# Patient Record
Sex: Female | Born: 1970
Health system: Southern US, Community
[De-identification: ages and names within clinical notes are randomized; demographics above are authoritative.]

## PROBLEM LIST (undated history)

## (undated) DIAGNOSIS — N200 Calculus of kidney: Secondary | ICD-10-CM

## (undated) DIAGNOSIS — D649 Anemia, unspecified: Secondary | ICD-10-CM

## (undated) DIAGNOSIS — G43009 Migraine without aura, not intractable, without status migrainosus: Secondary | ICD-10-CM

## (undated) DIAGNOSIS — Z8042 Family history of malignant neoplasm of prostate: Secondary | ICD-10-CM

## (undated) DIAGNOSIS — Z803 Family history of malignant neoplasm of breast: Secondary | ICD-10-CM

## (undated) DIAGNOSIS — Z800 Family history of malignant neoplasm of digestive organs: Secondary | ICD-10-CM

## (undated) HISTORY — DX: Calculus of kidney: N20.0

## (undated) HISTORY — PX: WISDOM TOOTH EXTRACTION: SHX21

## (undated) HISTORY — DX: Family history of malignant neoplasm of prostate: Z80.42

## (undated) HISTORY — DX: Family history of malignant neoplasm of breast: Z80.3

## (undated) HISTORY — DX: Family history of malignant neoplasm of digestive organs: Z80.0

## (undated) HISTORY — DX: Migraine without aura, not intractable, without status migrainosus: G43.009

---

## 2015-05-23 LAB — HM PAP SMEAR: HM Pap smear: NORMAL

## 2015-06-03 DIAGNOSIS — Z01419 Encounter for gynecological examination (general) (routine) without abnormal findings: Secondary | ICD-10-CM | POA: Diagnosis not present

## 2015-06-03 DIAGNOSIS — Z130 Encounter for screening for diseases of the blood and blood-forming organs and certain disorders involving the immune mechanism: Secondary | ICD-10-CM | POA: Diagnosis not present

## 2015-07-08 ENCOUNTER — Other Ambulatory Visit: Payer: Self-pay | Admitting: Obstetrics

## 2015-07-08 DIAGNOSIS — Z1231 Encounter for screening mammogram for malignant neoplasm of breast: Secondary | ICD-10-CM

## 2015-08-06 ENCOUNTER — Other Ambulatory Visit: Payer: Self-pay | Admitting: Obstetrics

## 2015-08-06 ENCOUNTER — Ambulatory Visit
Admission: RE | Admit: 2015-08-06 | Discharge: 2015-08-06 | Disposition: A | Discharge status: Home / Self Care | Payer: 59 | Source: Ambulatory Visit | Attending: Obstetrics | Admitting: Obstetrics

## 2015-08-06 DIAGNOSIS — Z1231 Encounter for screening mammogram for malignant neoplasm of breast: Secondary | ICD-10-CM | POA: Diagnosis not present

## 2015-08-23 ENCOUNTER — Other Ambulatory Visit: Payer: Self-pay | Admitting: Obstetrics

## 2015-08-23 DIAGNOSIS — R928 Other abnormal and inconclusive findings on diagnostic imaging of breast: Secondary | ICD-10-CM

## 2015-08-30 ENCOUNTER — Ambulatory Visit
Admission: RE | Admit: 2015-08-30 | Discharge: 2015-08-30 | Disposition: A | Discharge status: Home / Self Care | Payer: 59 | Source: Ambulatory Visit | Attending: Obstetrics | Admitting: Obstetrics

## 2015-08-30 DIAGNOSIS — R928 Other abnormal and inconclusive findings on diagnostic imaging of breast: Secondary | ICD-10-CM | POA: Diagnosis not present

## 2015-08-30 DIAGNOSIS — N6489 Other specified disorders of breast: Secondary | ICD-10-CM | POA: Insufficient documentation

## 2015-08-30 DIAGNOSIS — R922 Inconclusive mammogram: Secondary | ICD-10-CM | POA: Diagnosis not present

## 2015-08-31 ENCOUNTER — Other Ambulatory Visit: Payer: Self-pay | Admitting: Obstetrics

## 2015-08-31 DIAGNOSIS — N63 Unspecified lump in unspecified breast: Secondary | ICD-10-CM

## 2015-09-07 ENCOUNTER — Ambulatory Visit
Admission: RE | Admit: 2015-09-07 | Discharge: 2015-09-07 | Disposition: A | Discharge status: Home / Self Care | Payer: 59 | Source: Ambulatory Visit | Attending: Obstetrics | Admitting: Obstetrics

## 2015-09-07 DIAGNOSIS — N63 Unspecified lump in unspecified breast: Secondary | ICD-10-CM

## 2015-09-07 DIAGNOSIS — N6032 Fibrosclerosis of left breast: Secondary | ICD-10-CM | POA: Insufficient documentation

## 2015-09-07 DIAGNOSIS — D242 Benign neoplasm of left breast: Secondary | ICD-10-CM | POA: Insufficient documentation

## 2015-09-07 DIAGNOSIS — N6022 Fibroadenosis of left breast: Secondary | ICD-10-CM | POA: Diagnosis not present

## 2015-09-07 HISTORY — PX: BREAST BIOPSY: SHX20

## 2015-09-08 LAB — SURGICAL PATHOLOGY

## 2015-09-21 ENCOUNTER — Other Ambulatory Visit
Admission: RE | Admit: 2015-09-21 | Discharge: 2015-09-21 | Disposition: A | Discharge status: Home / Self Care | Payer: 59 | Source: Ambulatory Visit | Attending: Surgery | Admitting: Surgery

## 2015-09-21 DIAGNOSIS — N63 Unspecified lump in breast: Secondary | ICD-10-CM | POA: Diagnosis not present

## 2015-09-21 LAB — CBC WITH DIFFERENTIAL/PLATELET
Basophils Absolute: 0 10*3/uL (ref 0–0.1)
Basophils Relative: 1 %
Eosinophils Absolute: 0.1 10*3/uL (ref 0–0.7)
Eosinophils Relative: 1 %
HEMATOCRIT: 38.2 % (ref 35.0–47.0)
HEMOGLOBIN: 13.2 g/dL (ref 12.0–16.0)
LYMPHS ABS: 2.7 10*3/uL (ref 1.0–3.6)
LYMPHS PCT: 39 %
MCH: 30.6 pg (ref 26.0–34.0)
MCHC: 34.4 g/dL (ref 32.0–36.0)
MCV: 88.9 fL (ref 80.0–100.0)
Monocytes Absolute: 0.5 10*3/uL (ref 0.2–0.9)
Monocytes Relative: 7 %
NEUTROS PCT: 52 %
Neutro Abs: 3.7 10*3/uL (ref 1.4–6.5)
Platelets: 283 10*3/uL (ref 150–440)
RBC: 4.3 MIL/uL (ref 3.80–5.20)
RDW: 12.7 % (ref 11.5–14.5)
WBC: 6.9 10*3/uL (ref 3.6–11.0)

## 2015-09-21 LAB — BASIC METABOLIC PANEL
Anion gap: 8 (ref 5–15)
BUN: 16 mg/dL (ref 6–20)
CHLORIDE: 105 mmol/L (ref 101–111)
CO2: 27 mmol/L (ref 22–32)
Calcium: 9.3 mg/dL (ref 8.9–10.3)
Creatinine, Ser: 0.78 mg/dL (ref 0.44–1.00)
GFR calc non Af Amer: >60 mL/min (ref >60)
Glucose, Bld: 103 mg/dL — ABNORMAL HIGH (ref 65–99)
POTASSIUM: 4.1 mmol/L (ref 3.5–5.1)
SODIUM: 140 mmol/L (ref 135–145)

## 2015-09-22 ENCOUNTER — Other Ambulatory Visit: Payer: Self-pay | Admitting: Surgery

## 2015-09-22 DIAGNOSIS — N63 Unspecified lump in unspecified breast: Secondary | ICD-10-CM

## 2015-10-07 ENCOUNTER — Encounter: Payer: Self-pay | Admitting: *Deleted

## 2015-10-07 ENCOUNTER — Inpatient Hospital Stay: Admission: RE | Admit: 2015-10-07 | Payer: 59 | Source: Ambulatory Visit

## 2015-10-07 NOTE — Patient Instructions (Signed)
  Your procedure is scheduled on: 10-15-15 Report to [facility] @ 9:30 PER PT   Remember: Instructions that are not followed completely may result in serious medical risk, up to and including death, or upon the discretion of your surgeon and anesthesiologist your surgery may need to be rescheduled.    _X___ 1. Do not eat food or drink liquids after midnight. No gum chewing or hard candies.     _X___ 2. No Alcohol for 24 hours before or after surgery.   ____ 3. Bring all medications with you on the day of surgery if instructed.    ____ 4. Notify your doctor if there is any change in your medical condition     (cold, fever, infections).     Do not wear jewelry, make-up, hairpins, clips or nail polish.  Do not wear lotions, powders, or perfumes. You may wear deodorant.  Do not shave 48 hours prior to surgery. Men may shave face and neck.  Do not bring valuables to the hospital.    [facility] is not responsible for any belongings or valuables.               Contacts, dentures or bridgework may not be worn into surgery.  Leave your suitcase in the car. After surgery it may be brought to your room.  For patients admitted to the hospital, discharge time is determined by your treatment team.   Patients discharged the day of surgery will not be allowed to drive home.   Please read over the following fact sheets that you were given:     ____ Take these medicines the morning of surgery with A SIP OF WATER:    1. NONE  2.   3.   4.  5.  6.  ____ Fleet Enema (as directed)   ____ Use CHG Soap as directed  ____ Use inhalers on the day of surgery  ____ Stop metformin 2 days prior to surgery    ____ Take 1/2 of usual insulin dose the night before surgery and none on the morning of surgery.   ____ Stop Coumadin/Plavix/aspirin-N/A  _X___ Stop Anti-inflammatories-NO NSAIDS OR ASA PRODUCTS-TYLENOL OK TO TAKE   ____ Stop supplements until after surgery.    ____ Bring  C-Pap to the hospital.

## 2015-10-15 ENCOUNTER — Ambulatory Visit: Payer: 59 | Admitting: Anesthesiology

## 2015-10-15 ENCOUNTER — Encounter
Admission: RE | Disposition: A | Discharge status: Home / Self Care | Payer: Self-pay | Source: Ambulatory Visit | Attending: Surgery

## 2015-10-15 ENCOUNTER — Encounter: Payer: Self-pay | Admitting: *Deleted

## 2015-10-15 ENCOUNTER — Ambulatory Visit
Admission: RE | Admit: 2015-10-15 | Discharge: 2015-10-15 | Disposition: A | Discharge status: Home / Self Care | Payer: 59 | Source: Ambulatory Visit | Attending: Surgery | Admitting: Surgery

## 2015-10-15 DIAGNOSIS — N63 Unspecified lump in unspecified breast: Secondary | ICD-10-CM

## 2015-10-15 DIAGNOSIS — N62 Hypertrophy of breast: Secondary | ICD-10-CM | POA: Insufficient documentation

## 2015-10-15 DIAGNOSIS — R928 Other abnormal and inconclusive findings on diagnostic imaging of breast: Secondary | ICD-10-CM | POA: Diagnosis not present

## 2015-10-15 DIAGNOSIS — N6022 Fibroadenosis of left breast: Secondary | ICD-10-CM | POA: Insufficient documentation

## 2015-10-15 DIAGNOSIS — N189 Chronic kidney disease, unspecified: Secondary | ICD-10-CM | POA: Diagnosis not present

## 2015-10-15 DIAGNOSIS — D242 Benign neoplasm of left breast: Secondary | ICD-10-CM | POA: Insufficient documentation

## 2015-10-15 DIAGNOSIS — N6032 Fibrosclerosis of left breast: Secondary | ICD-10-CM | POA: Diagnosis not present

## 2015-10-15 HISTORY — DX: Anemia, unspecified: D64.9

## 2015-10-15 HISTORY — PX: BREAST LUMPECTOMY WITH NEEDLE LOCALIZATION: SHX5759

## 2015-10-15 HISTORY — PX: BREAST LUMPECTOMY: SHX2

## 2015-10-15 LAB — POCT PREGNANCY, URINE: Preg Test, Ur: NEGATIVE

## 2015-10-15 SURGERY — BREAST LUMPECTOMY WITH NEEDLE LOCALIZATION
Anesthesia: General | Laterality: Left

## 2015-10-15 MED ORDER — OXYCODONE HCL 5 MG PO TABS: 5.0000 mg | ORAL_TABLET | Freq: Once | ORAL | Status: DC | PRN

## 2015-10-15 MED ORDER — DEXMEDETOMIDINE HCL 200 MCG/2ML IV SOLN: INTRAVENOUS | Status: DC | PRN

## 2015-10-15 MED ORDER — BUPIVACAINE-EPINEPHRINE 0.5% -1:200000 IJ SOLN
INTRAMUSCULAR | Status: DC | PRN
  Administered 2015-10-15: 10 mL

## 2015-10-15 MED ORDER — DEXAMETHASONE SODIUM PHOSPHATE 10 MG/ML IJ SOLN
INTRAMUSCULAR | Status: DC | PRN
  Administered 2015-10-15: 10 mg via INTRAVENOUS

## 2015-10-15 MED ORDER — HYDROCODONE-ACETAMINOPHEN 5-325 MG PO TABS: 1.0000 | ORAL_TABLET | ORAL | Status: DC | PRN

## 2015-10-15 MED ORDER — LIDOCAINE 2% (20 MG/ML) 5 ML SYRINGE
INTRAMUSCULAR | Status: DC | PRN
  Administered 2015-10-15: 80 mg via INTRAVENOUS

## 2015-10-15 MED ORDER — PROPOFOL 10 MG/ML IV BOLUS
INTRAVENOUS | Status: DC | PRN
  Administered 2015-10-15: 150 mg via INTRAVENOUS
  Administered 2015-10-15: 50 mg via INTRAVENOUS

## 2015-10-15 MED ORDER — MIDAZOLAM HCL 5 MG/5ML IJ SOLN
INTRAMUSCULAR | Status: DC | PRN
  Administered 2015-10-15: 2 mg via INTRAVENOUS

## 2015-10-15 MED ORDER — OXYCODONE HCL 5 MG/5ML PO SOLN: 5.0000 mg | Freq: Once | ORAL | Status: DC | PRN

## 2015-10-15 MED ORDER — FENTANYL CITRATE (PF) 100 MCG/2ML IJ SOLN
25.0000 ug | INTRAMUSCULAR | Status: DC | PRN
  Administered 2015-10-15 (×4): 25 ug via INTRAVENOUS

## 2015-10-15 MED ORDER — ONDANSETRON HCL 4 MG/2ML IJ SOLN
INTRAMUSCULAR | Status: DC | PRN
  Administered 2015-10-15: 4 mg via INTRAVENOUS

## 2015-10-15 MED ORDER — FAMOTIDINE 20 MG PO TABS
ORAL_TABLET | ORAL | Status: AC
  Filled 2015-10-15: qty 1

## 2015-10-15 MED ORDER — FENTANYL CITRATE (PF) 100 MCG/2ML IJ SOLN
INTRAMUSCULAR | Status: AC
  Administered 2015-10-15: 25 ug via INTRAVENOUS
  Filled 2015-10-15: qty 2

## 2015-10-15 MED ORDER — BUPIVACAINE-EPINEPHRINE (PF) 0.5% -1:200000 IJ SOLN
INTRAMUSCULAR | Status: AC
  Filled 2015-10-15: qty 30

## 2015-10-15 MED ORDER — FAMOTIDINE 20 MG PO TABS
20.0000 mg | ORAL_TABLET | Freq: Once | ORAL | Status: AC
  Administered 2015-10-15: 20 mg via ORAL

## 2015-10-15 MED ORDER — FENTANYL CITRATE (PF) 100 MCG/2ML IJ SOLN
INTRAMUSCULAR | Status: DC | PRN
  Administered 2015-10-15 (×2): 25 ug via INTRAVENOUS
  Administered 2015-10-15: 50 ug via INTRAVENOUS

## 2015-10-15 MED ORDER — LACTATED RINGERS IV SOLN
INTRAVENOUS | Status: DC
  Administered 2015-10-15: 12:00:00 via INTRAVENOUS

## 2015-10-15 SURGICAL SUPPLY — 25 items
BLADE SURG 15 STRL LF DISP TIS (BLADE) ×1 IMPLANT
BLADE SURG 15 STRL SS (BLADE) ×1
CANISTER SUCT 1200ML W/VALVE (MISCELLANEOUS) ×2 IMPLANT
CHLORAPREP W/TINT 26ML (MISCELLANEOUS) ×2 IMPLANT
DEVICE DUBIN SPECIMEN MAMMOGRA (MISCELLANEOUS) ×2 IMPLANT
DRAPE LAPAROTOMY 77X122 PED (DRAPES) ×2 IMPLANT
ELECT REM PT RETURN 9FT ADLT (ELECTROSURGICAL) ×2
ELECTRODE REM PT RTRN 9FT ADLT (ELECTROSURGICAL) ×1 IMPLANT
GLOVE BIO SURGEON STRL SZ7.5 (GLOVE) ×2 IMPLANT
GOWN STRL REUS W/ TWL LRG LVL3 (GOWN DISPOSABLE) ×2 IMPLANT
GOWN STRL REUS W/TWL LRG LVL3 (GOWN DISPOSABLE) ×2
KIT RM TURNOVER STRD PROC AR (KITS) ×2 IMPLANT
LABEL OR SOLS (LABEL) ×2 IMPLANT
LIQUID BAND (GAUZE/BANDAGES/DRESSINGS) ×2 IMPLANT
MARGIN MAP 10MM (MISCELLANEOUS) ×2 IMPLANT
NEEDLE HYPO 25X1 1.5 SAFETY (NEEDLE) ×2 IMPLANT
PACK BASIN MINOR ARMC (MISCELLANEOUS) ×2 IMPLANT
SUT CHROMIC 4 0 RB 1X27 (SUTURE) ×2 IMPLANT
SUT ETHILON 3-0 FS-10 30 BLK (SUTURE) ×2
SUT MNCRL 4-0 (SUTURE) ×1
SUT MNCRL 4-0 27XMFL (SUTURE) ×1
SUTURE EHLN 3-0 FS-10 30 BLK (SUTURE) ×1 IMPLANT
SUTURE MNCRL 4-0 27XMF (SUTURE) ×1 IMPLANT
SYRINGE 10CC LL (SYRINGE) ×2 IMPLANT
WATER STERILE IRR 1000ML POUR (IV SOLUTION) ×2 IMPLANT

## 2015-10-15 NOTE — Anesthesia Postprocedure Evaluation (Signed)
Anesthesia Post Note  Patient: Gina Fleming  Procedure(s) Performed: Procedure(s) (LRB): BREAST LUMPECTOMY WITH NEEDLE LOCALIZATION (Left)  Patient location during evaluation: PACU Anesthesia Type: General Level of consciousness: awake and alert Pain management: pain level controlled Vital Signs Assessment: post-procedure vital signs reviewed and stable Respiratory status: spontaneous breathing, nonlabored ventilation, respiratory function stable and patient connected to nasal cannula oxygen Cardiovascular status: blood pressure returned to baseline and stable Postop Assessment: no signs of nausea or vomiting Anesthetic complications: no    Last Vitals:  Filed Vitals:   10/15/15 1353 10/15/15 1404  BP: 112/79 110/72  Pulse: 86   Temp: 37.2 C 36.1 C  Resp: 20 16    Last Pain:  Filed Vitals:   10/15/15 1410  PainSc: 3                  Broadus John K Piscitello

## 2015-10-15 NOTE — Anesthesia Procedure Notes (Signed)
Procedure Name: LMA Insertion Date/Time: 10/15/2015 12:02 PM Performed by: Doreen Salvage Pre-anesthesia Checklist: Patient identified, Patient being monitored, Timeout performed, Emergency Drugs available and Suction available Patient Re-evaluated:Patient Re-evaluated prior to inductionOxygen Delivery Method: Circle system utilized Preoxygenation: Pre-oxygenation with 100% oxygen Intubation Type: IV induction Ventilation: Mask ventilation without difficulty LMA: LMA inserted LMA Size: 3.5 Tube type: Oral Number of attempts: 1 Placement Confirmation: positive ETCO2 and breath sounds checked- equal and bilateral Tube secured with: Tape Dental Injury: Teeth and Oropharynx as per pre-operative assessment

## 2015-10-15 NOTE — H&P (Signed)
  She reports no change in condition since the day of the office examination.  She does have history of abnormal mammogram and has had ultrasound-guided core needle biopsy with findings of micropapillary was at the 3:30 position of the left breast 4 cm from the nipple  She has had x-ray needle localization. I have reviewed the subsequent mammograms.  I discussed the plan for excision of left breast mass. The left side was marked YES.

## 2015-10-15 NOTE — Op Note (Signed)
OPERATIVE REPORT  PREOPERATIVE  DIAGNOSIS: . Intraductal papillomas left breast  POSTOPERATIVE DIAGNOSIS: . Intraductal papillomas {  PROCEDURE: . Excision left breast mass  ANESTHESIA:  General  SURGEON: Rochel Brome  MD   INDICATIONS: . She had recent ultrasound-guided core needle biopsy of a left breast mass with findings of intraductal papillomas. Excision was recommended for further evaluation and treatment.  She did have preoperative insertion of a Kopan's wire. I reviewed the mammograms demonstrating the proximity of the wire and the biopsy marker.  With the patient on the operating table in the supine position she was placed under anesthesia. The left arm was placed on a lateral arm support. The dressing was removed from the left breast exposing the Kopan's wire which entered the lateral aspect of the left breast. The wire was cut 2 cm from the skin. The breast was prepared with ChloraPrep solution and draped in a sterile manner.. A curvilinear incision was made in the lateral aspect of the left breast from approximately 2:00 to 4:00 position some 7 cm from the nipple. This is carried down through subcutaneous tissues and encountered the wire. A portion of tissue surrounding the wire which was spherical and approximately 3 cm in dimension was removed. The specimen was marked with margin maps to mark the cranial caudal medial lateral superficial and deep margins. The specimen was submitted for specimen mammogram. I reviewed the x-ray demonstrating the location of the biopsy marker within the specimen. The specimen was sent on to the lab for routine pathology. The wound was inspected and could see hemostasis was intact. The wound was infiltrated with half percent Sensorcaine with epinephrine. Skin edges were also infiltrated. The wound was closed with running 4-0 Monocryl subcuticular suture and LiquiBand. The patient tolerated surgery satisfactorily and was then prepared for transfer to the  recovery room  [facility].D.

## 2015-10-15 NOTE — Transfer of Care (Signed)
Immediate Anesthesia Transfer of Care Note  Patient: Gina Fleming  Procedure(s) Performed: Procedure(s): BREAST LUMPECTOMY WITH NEEDLE LOCALIZATION (Left)  Patient Location: PACU  Anesthesia Type:General  Level of Consciousness: sedated  Airway & Oxygen Therapy: Patient Spontanous Breathing and Patient connected to face mask oxygen  Post-op Assessment: Report given to RN and Post -op Vital signs reviewed and stable  Post vital signs: Reviewed and stable  Last Vitals:  Filed Vitals:   10/15/15 1108  Temp: 37.3 C    Last Pain: There were no vitals filed for this visit.       Complications: No apparent anesthesia complications

## 2015-10-15 NOTE — Anesthesia Preprocedure Evaluation (Signed)
Anesthesia Evaluation  Patient identified by MRN, date of birth, ID band Patient awake    Reviewed: Allergy & Precautions, H&P , NPO status , Patient's Chart, lab work & pertinent test results  History of Anesthesia Complications Negative for: history of anesthetic complications  Airway Mallampati: III  TM Distance: >3 FB Neck ROM: full    Dental  (+) Poor Dentition   Pulmonary neg pulmonary ROS, neg shortness of breath,    Pulmonary exam normal breath sounds clear to auscultation       Cardiovascular Exercise Tolerance: Good (-) angina(-) Past MI and (-) DOE negative cardio ROS Normal cardiovascular exam Rhythm:regular Rate:Normal     Neuro/Psych  Headaches, negative psych ROS   GI/Hepatic negative GI ROS, Neg liver ROS,   Endo/Other  negative endocrine ROS  Renal/GU Renal disease  negative genitourinary   Musculoskeletal   Abdominal   Peds  Hematology negative hematology ROS (+)   Anesthesia Other Findings Past Medical History:   Headache                                                       Comment:MIGRAINES   Anemia                                                         Comment:H/O    Chronic kidney disease                                         Comment:H/O STONES  Past Surgical History:   WISDOM TOOTH EXTRACTION                                       BREAST EXCISIONAL BIOPSY                        Left 10/15/2015     Comment:UNK   BREAST BIOPSY                                   Left 09/07/2015      Comment:US guided biopsy w/ clip  BMI    Body Mass Index   26.62 kg/m 2      Reproductive/Obstetrics negative OB ROS                             Anesthesia Physical Anesthesia Plan  ASA: III  Anesthesia Plan: General LMA   Post-op Pain Management:    Induction:   Airway Management Planned:   Additional Equipment:   Intra-op Plan:   Post-operative Plan:    Informed Consent: I have reviewed the patients History and Physical, chart, labs and discussed the procedure including the risks, benefits and alternatives for the proposed anesthesia with the patient or authorized representative who has indicated his/her understanding and acceptance.   Dental Advisory Given  Plan  Discussed with: Anesthesiologist, CRNA and Surgeon  Anesthesia Plan Comments:         Anesthesia Quick Evaluation

## 2015-10-15 NOTE — Discharge Instructions (Signed)
Take Tylenol or Norco if needed for pain.  Wear bra as desired for support and comfort.  May shower.  Gradually resume usual activities as tolerated.  AMBULATORY SURGERY  DISCHARGE INSTRUCTIONS   1) The drugs that you were given will stay in your system until tomorrow so for the next 24 hours you should not:  A) Drive an automobile B) Make any legal decisions C) Drink any alcoholic beverage   2) You may resume regular meals tomorrow.  Today it is better to start with liquids and gradually work up to solid foods.  You may eat anything you prefer, but it is better to start with liquids, then soup and crackers, and gradually work up to solid foods.   3) Please notify your doctor immediately if you have any unusual bleeding, trouble breathing, redness and pain at the surgery site, drainage, fever, or pain not relieved by medication.    4) Additional Instructions:        Please contact your physician with any problems or Same Day Surgery at [phone], Monday through Friday 6 am to 4 pm, or [place] at [facility] number at [phone].

## 2015-10-17 ENCOUNTER — Encounter: Payer: Self-pay | Admitting: Surgery

## 2015-10-19 LAB — SURGICAL PATHOLOGY

## 2015-12-17 DIAGNOSIS — H5213 Myopia, bilateral: Secondary | ICD-10-CM | POA: Diagnosis not present

## 2015-12-17 DIAGNOSIS — H524 Presbyopia: Secondary | ICD-10-CM | POA: Diagnosis not present

## 2016-01-04 ENCOUNTER — Encounter: Payer: Self-pay | Admitting: Family Medicine

## 2016-01-04 ENCOUNTER — Ambulatory Visit (INDEPENDENT_AMBULATORY_CARE_PROVIDER_SITE_OTHER): Payer: 59 | Admitting: Family Medicine

## 2016-01-04 VITALS — BP 98/62 | HR 76 | Temp 98.6°F | Ht 65.0 in | Wt 160.5 lb

## 2016-01-04 DIAGNOSIS — Z0001 Encounter for general adult medical examination with abnormal findings: Secondary | ICD-10-CM

## 2016-01-04 DIAGNOSIS — R6889 Other general symptoms and signs: Secondary | ICD-10-CM

## 2016-01-04 DIAGNOSIS — M238X9 Other internal derangements of unspecified knee: Secondary | ICD-10-CM

## 2016-01-04 DIAGNOSIS — Z7189 Other specified counseling: Secondary | ICD-10-CM

## 2016-01-04 NOTE — Patient Instructions (Addendum)
Check your last tetanus shot date.  I'll check on getting your old records.   I would get a flu shot each fall.   Take care.  Glad to see you.  Update me as needed.

## 2016-01-04 NOTE — Progress Notes (Signed)
Pre visit review using our clinic review tool, if applicable. No additional management support is needed unless otherwise documented below in the visit note. 

## 2016-01-05 ENCOUNTER — Encounter: Payer: Self-pay | Admitting: Family Medicine

## 2016-01-05 DIAGNOSIS — Z0001 Encounter for general adult medical examination with abnormal findings: Secondary | ICD-10-CM | POA: Insufficient documentation

## 2016-01-05 DIAGNOSIS — Z7189 Other specified counseling: Secondary | ICD-10-CM | POA: Insufficient documentation

## 2016-01-05 DIAGNOSIS — M238X9 Other internal derangements of unspecified knee: Secondary | ICD-10-CM | POA: Insufficient documentation

## 2016-01-05 NOTE — Progress Notes (Signed)
New patient.  Requesting records.   CPE- See plan.  Routine anticipatory guidance given to patient.  See health maintenance. Tetanus d/w pt.  See AVS.  Flu shot to be done at work.  pna and shingles not due, dw pt Pap per gyn, 05/2015 normal per patient report Mammogram up to date DXA not due Living will d/w pt.  Husband designated if patient were incapacitated, then her brother [name] if husband incapacitated.   Diet and exercise d/w pt, she is using weight watchers.  HIV screen done at work ~1999.   PMH and SH reviewed  Meds, vitals, and allergies reviewed.   ROS: Per HPI.  Unless specifically indicated otherwise in HPI, the patient denies:  General: fever. Eyes: acute vision changes ENT: sore throat Cardiovascular: chest pain Respiratory: SOB GI: vomiting GU: dysuria Musculoskeletal: acute back pain Derm: acute rash Neuro: acute motor dysfunction Psych: worsening mood Endocrine: polydipsia Heme: bleeding Allergy: hayfever  GEN: nad, alert and oriented HEENT: mucous membranes moist NECK: supple w/o LA CV: rrr. PULM: ctab, no inc wob ABD: soft, +bs EXT: no edema SKIN: no acute rash Possible cyst noted near L 2nd finger DIP.  B knee crepitus audible (no pain, she'll update me as needed)

## 2016-01-05 NOTE — Assessment & Plan Note (Signed)
Tetanus d/w pt.  See AVS.  Flu shot to be done at work.  pna and shingles not due, dw pt Pap per gyn, 05/2015 normal per patient report Mammogram up to date DXA not due Living will d/w pt.  Husband designated if patient were incapacitated, then her brother [name] if husband incapacitated.   Diet and exercise d/w pt, she is using weight watchers.  HIV screen done at work ~1999.

## 2016-01-05 NOTE — Assessment & Plan Note (Signed)
But no pain, she'll update me as needed.  She agrees.

## 2016-01-05 NOTE — Assessment & Plan Note (Signed)
Living will d/w pt. Husband designated if patient were incapacitated, then her brother [name] if husband incapacitated.  

## 2016-01-06 ENCOUNTER — Encounter: Payer: Self-pay | Admitting: Family Medicine

## 2016-01-16 ENCOUNTER — Encounter: Payer: Self-pay | Admitting: Family Medicine

## 2016-01-16 DIAGNOSIS — G43009 Migraine without aura, not intractable, without status migrainosus: Secondary | ICD-10-CM | POA: Insufficient documentation

## 2016-01-19 MED FILL — ELETRIPTAN HBR 40 MG TABLET: 40 | 30 days supply | Qty: 9 | Fill #0

## 2016-02-15 ENCOUNTER — Encounter: Payer: Self-pay | Admitting: Family Medicine

## 2016-02-15 LAB — CHOLESTEROL, TOTAL
CHOLESTEROL, TOTAL: 188
CREATININE: 0.71
Glucose: 88
HDL: 40
LDL Cholesterol (Calc): 115
TRIGLYCERIDES: 164

## 2016-02-28 ENCOUNTER — Other Ambulatory Visit: Payer: Self-pay | Admitting: *Deleted

## 2016-02-28 NOTE — Telephone Encounter (Addendum)
Faxed refill request. Last Filled:   Historical.  Patient requests 90 day supply (27 tabs).  Please advise.

## 2016-02-29 MED ORDER — ELETRIPTAN HYDROBROMIDE 40 MG PO TABS: 40.0000 mg | ORAL_TABLET | ORAL | 1 refills | Status: DC | PRN

## 2016-02-29 MED FILL — ELETRIPTAN HBR 40 MG TABLET: 40 | 90 days supply | Qty: 27 | Fill #0

## 2016-02-29 NOTE — Telephone Encounter (Signed)
Sent. Thanks.   

## 2016-03-13 MED FILL — LEVONOR-ETH ESTRAD 0.1-0.02: 0.1-20 | 84 days supply | Qty: 84 | Fill #0

## 2016-05-16 MED FILL — LEVONOR-ETH ESTRAD 0.1-0.02: 0.1-20 | 28 days supply | Qty: 28 | Fill #0

## 2016-06-06 DIAGNOSIS — Z130 Encounter for screening for diseases of the blood and blood-forming organs and certain disorders involving the immune mechanism: Secondary | ICD-10-CM | POA: Diagnosis not present

## 2016-06-06 DIAGNOSIS — Z01419 Encounter for gynecological examination (general) (routine) without abnormal findings: Secondary | ICD-10-CM | POA: Diagnosis not present

## 2016-06-13 MED FILL — LEVONOR-ETH ESTRAD 0.1-0.02: 0.1-20 | 63 days supply | Qty: 84 | Fill #0

## 2016-06-13 MED FILL — ELETRIPTAN HBR 40 MG TABLET: 40 | 30 days supply | Qty: 9 | Fill #1

## 2016-07-17 ENCOUNTER — Telehealth: Payer: Self-pay

## 2016-07-17 MED FILL — ELETRIPTAN HBR 40 MG TABLET: 40 | 60 days supply | Qty: 18 | Fill #2

## 2016-07-17 NOTE — Telephone Encounter (Signed)
Pt left v/m requesting migraine med switched to sumatriptan 100 mg due to cost of med. [facility] outpt pharmacy. Last annual 01/04/16.

## 2016-07-18 MED ORDER — SUMATRIPTAN SUCCINATE 100 MG PO TABS: 100.0000 mg | ORAL_TABLET | Freq: Every day | ORAL | 1 refills | Status: DC | PRN

## 2016-07-18 NOTE — Telephone Encounter (Signed)
Sent.  This is a reasonable substitution. Update me if not effective. Thanks.

## 2016-07-18 NOTE — Telephone Encounter (Signed)
Patient notified as instructed by telephone and verbalized understanding. 

## 2016-07-26 MED FILL — CIPROFLOXACIN HCL 500 MG TA: 500 | 1 days supply | Qty: 1 | Fill #0

## 2016-08-14 MED FILL — LEVONOR-ETH ESTRAD 0.1-0.02: 0.1-20 | 84 days supply | Qty: 112 | Fill #1

## 2016-10-17 ENCOUNTER — Other Ambulatory Visit: Payer: Self-pay | Admitting: *Deleted

## 2016-10-17 ENCOUNTER — Other Ambulatory Visit: Payer: Self-pay | Admitting: Family Medicine

## 2016-10-17 MED ORDER — SUMATRIPTAN SUCCINATE 100 MG PO TABS: 100.0000 mg | ORAL_TABLET | Freq: Every day | ORAL | 1 refills | Status: DC | PRN

## 2016-10-17 MED FILL — SUMATRIPTAN SUCC 100 MG TAB: 100 | 30 days supply | Qty: 18 | Fill #0

## 2016-10-17 NOTE — Telephone Encounter (Addendum)
Medication is no longer on list. Spoke to patient and was advised that she is not using Relpax because of the cost and must have ordered this by mistake. Patient stated that she does need a refill on Imitrex instead Last refill 07/18/16 #30/1 Last office visit 01/04/16

## 2016-10-17 NOTE — Telephone Encounter (Signed)
Ok to send in Imitrex

## 2016-11-08 MED FILL — LEVONOR-ETH ESTRAD 0.1-0.02: 0.1-20 | 84 days supply | Qty: 112 | Fill #2

## 2017-01-23 MED FILL — SUMATRIPTAN SUCC 100 MG TAB: 100 | 30 days supply | Qty: 18 | Fill #1

## 2017-02-05 MED FILL — LEVONOR-ETH ESTRAD 0.1-0.02: 0.1-20 | 84 days supply | Qty: 112 | Fill #3

## 2017-04-09 MED FILL — SUMATRIPTAN SUCC 100 MG TAB: 100 | 30 days supply | Qty: 18 | Fill #2

## 2017-04-20 ENCOUNTER — Ambulatory Visit (INDEPENDENT_AMBULATORY_CARE_PROVIDER_SITE_OTHER): Payer: 59 | Admitting: Family Medicine

## 2017-04-20 ENCOUNTER — Encounter: Payer: Self-pay | Admitting: Family Medicine

## 2017-04-20 VITALS — BP 112/82 | HR 74 | Temp 98.4°F | Ht 65.0 in | Wt 134.8 lb

## 2017-04-20 DIAGNOSIS — Z7189 Other specified counseling: Secondary | ICD-10-CM

## 2017-04-20 DIAGNOSIS — Z0001 Encounter for general adult medical examination with abnormal findings: Secondary | ICD-10-CM | POA: Diagnosis not present

## 2017-04-20 DIAGNOSIS — G43009 Migraine without aura, not intractable, without status migrainosus: Secondary | ICD-10-CM

## 2017-04-20 LAB — BASIC METABOLIC PANEL
BUN: 13 mg/dL (ref 6–23)
CALCIUM: 9.3 mg/dL (ref 8.4–10.5)
CO2: 28 mEq/L (ref 19–32)
Chloride: 106 mEq/L (ref 96–112)
Creatinine, Ser: 0.7 mg/dL (ref 0.40–1.20)
GFR: 94.68 mL/min (ref >60.00)
GLUCOSE: 92 mg/dL (ref 70–99)
Potassium: 4.7 mEq/L (ref 3.5–5.1)
SODIUM: 140 meq/L (ref 135–145)

## 2017-04-20 MED ORDER — SUMATRIPTAN SUCCINATE 100 MG PO TABS: 100.0000 mg | ORAL_TABLET | Freq: Every day | ORAL | 3 refills | Status: DC | PRN

## 2017-04-20 MED ORDER — CYCLOBENZAPRINE HCL 10 MG PO TABS: 5.0000 mg | ORAL_TABLET | Freq: Three times a day (TID) | ORAL | 3 refills | Status: DC | PRN

## 2017-04-20 MED ORDER — ONDANSETRON HCL 4 MG PO TABS: 4.0000 mg | ORAL_TABLET | Freq: Three times a day (TID) | ORAL | 3 refills | Status: DC | PRN

## 2017-04-20 MED FILL — CYCLOBENZAPRINE 10 MG TAB: 10 | 10 days supply | Qty: 30 | Fill #0

## 2017-04-20 MED FILL — ONDANSETRON HCL 4 MG TABLET: 4 | 10 days supply | Qty: 30 | Fill #0

## 2017-04-20 NOTE — Progress Notes (Signed)
CPE- See plan.  Routine anticipatory guidance given to patient.  See health maintenance.  The possibility exists that previously documented standard health maintenance information may have been brought forward from a previous encounter into this note.  If needed, that same information has been updated to reflect the current situation based on today's encounter.    Tetanus 2014 Flu shot done at work.  pna and shingles not due, dw pt Pap per gyn, 05/2015 normal per patient report Mammogram due, d/w pt.   See AVS.   DXA not due Living will d/w pt.  Husband designated if patient were incapacitated, then her brother [name] if husband incapacitated.   Diet and exercise d/w pt, she in maintenance weight watchers.  Intentional weight loss.  Exercise was affected by shift change, but she is trying to get more exercise.   HIV screen done at work ~1999.    Not due for lipid check.  D/w pt.  It wouldn't change mgmt at this point.   Migraines d/w pt.  She has episodes of back to back HA but then will have weeks w/o sx.  Sx worse with sleep deprivation.  Her med helps eventually, no ADE on med.  Axert worked more quickly.  D/w pt about trial of proph med if she had need for more than 8 tabs of imitrex per month.  She is trying to limit caffeine, usually about 2 cups coffee per day.   zofran helped with nausea prev.  She  Hasn't used flexeril prev, d/w pt.   PMH and SH reviewed  Meds, vitals, and allergies reviewed.   ROS: Per HPI.  Unless specifically indicated otherwise in HPI, the patient denies:  General: fever. Eyes: acute vision changes ENT: sore throat Cardiovascular: chest pain Respiratory: SOB GI: vomiting GU: dysuria Musculoskeletal: acute back pain Derm: acute rash Neuro: acute motor dysfunction Psych: worsening mood Endocrine: polydipsia Heme: bleeding Allergy: hayfever  GEN: nad, alert and oriented HEENT: mucous membranes moist NECK: supple w/o LA CV: rrr. PULM: ctab, no inc  wob ABD: soft, +bs EXT: no edema SKIN: no acute rash

## 2017-04-20 NOTE — Patient Instructions (Addendum)
You can call for a mammogram at [facility] at [facility].  [place] to the lab on the way out.  We'll contact you with your lab report. Try flexeril as needed.  Sedation caution.  If frequent headaches, then let me know so we can consider options.  Take care.  Glad to see you.

## 2017-04-22 NOTE — Assessment & Plan Note (Signed)
Tetanus 2014 Flu shot done at work.  pna and shingles not due, dw pt Pap per gyn, 05/2015 normal per patient report Mammogram due, d/w pt.   See AVS.   DXA not due Living will d/w pt.  Husband designated if patient were incapacitated, then her brother [name] if husband incapacitated.   Diet and exercise d/w pt, she in maintenance weight watchers.  Intentional weight loss.  Exercise was affected by shift change, but she is trying to get more exercise.   HIV screen done at work ~1999.    Not due for lipid check.  D/w pt.  It wouldn't change mgmt at this point.

## 2017-04-22 NOTE — Assessment & Plan Note (Signed)
Can use flexeril and zofran as needed, then imitrex.  If needed frequently, then she'll update me and we'll consider proph med.  D/w pt about diet sleep and exercise, she agrees.  Okay for outpatient f/u.

## 2017-04-22 NOTE — Assessment & Plan Note (Signed)
Living will d/w pt. Husband designated if patient were incapacitated, then her brother [name] if husband incapacitated.  

## 2017-04-26 MED FILL — LEVONOR-ETH ESTRAD 0.1-0.02: 0.1-20 | 84 days supply | Qty: 84 | Fill #0

## 2017-05-02 ENCOUNTER — Other Ambulatory Visit: Payer: Self-pay | Admitting: Obstetrics

## 2017-05-02 DIAGNOSIS — Z1231 Encounter for screening mammogram for malignant neoplasm of breast: Secondary | ICD-10-CM

## 2017-07-02 MED FILL — SUMATRIPTAN SUCC 100 MG TAB: 100 | 30 days supply | Qty: 6 | Fill #3

## 2017-07-16 ENCOUNTER — Ambulatory Visit
Admission: RE | Admit: 2017-07-16 | Discharge: 2017-07-16 | Disposition: A | Discharge status: Home / Self Care | Payer: 59 | Source: Ambulatory Visit | Attending: Obstetrics | Admitting: Obstetrics

## 2017-07-16 DIAGNOSIS — N949 Unspecified condition associated with female genital organs and menstrual cycle: Secondary | ICD-10-CM | POA: Diagnosis not present

## 2017-07-16 DIAGNOSIS — R87610 Atypical squamous cells of undetermined significance on cytologic smear of cervix (ASC-US): Secondary | ICD-10-CM | POA: Diagnosis not present

## 2017-07-16 DIAGNOSIS — R928 Other abnormal and inconclusive findings on diagnostic imaging of breast: Secondary | ICD-10-CM | POA: Diagnosis not present

## 2017-07-16 DIAGNOSIS — N83201 Unspecified ovarian cyst, right side: Secondary | ICD-10-CM | POA: Diagnosis not present

## 2017-07-16 DIAGNOSIS — Z1231 Encounter for screening mammogram for malignant neoplasm of breast: Secondary | ICD-10-CM | POA: Insufficient documentation

## 2017-07-16 DIAGNOSIS — Z01419 Encounter for gynecological examination (general) (routine) without abnormal findings: Secondary | ICD-10-CM | POA: Diagnosis not present

## 2017-07-17 MED FILL — LEVONOR-ETH ESTRAD 0.1-0.02: 0.1-20 | 84 days supply | Qty: 84 | Fill #0

## 2017-07-19 ENCOUNTER — Other Ambulatory Visit: Payer: Self-pay | Admitting: Obstetrics

## 2017-07-19 DIAGNOSIS — R928 Other abnormal and inconclusive findings on diagnostic imaging of breast: Secondary | ICD-10-CM

## 2017-07-19 DIAGNOSIS — N6320 Unspecified lump in the left breast, unspecified quadrant: Secondary | ICD-10-CM

## 2017-07-24 ENCOUNTER — Ambulatory Visit
Admission: RE | Admit: 2017-07-24 | Discharge: 2017-07-24 | Disposition: A | Discharge status: Home / Self Care | Payer: 59 | Source: Ambulatory Visit | Attending: Obstetrics | Admitting: Obstetrics

## 2017-07-24 DIAGNOSIS — R928 Other abnormal and inconclusive findings on diagnostic imaging of breast: Secondary | ICD-10-CM | POA: Diagnosis not present

## 2017-07-24 DIAGNOSIS — N6002 Solitary cyst of left breast: Secondary | ICD-10-CM | POA: Insufficient documentation

## 2017-07-24 DIAGNOSIS — R922 Inconclusive mammogram: Secondary | ICD-10-CM | POA: Diagnosis not present

## 2017-08-02 MED FILL — SUMATRIPTAN SUCC 100 MG TAB: 100 | 90 days supply | Qty: 24 | Fill #0

## 2017-09-19 MED FILL — LEVONOR-ETH ESTRAD 0.1-0.02: 0.1-20 | 84 days supply | Qty: 112 | Fill #0

## 2017-12-12 MED FILL — SUMATRIPTAN SUCC 100 MG TAB: 100 | 90 days supply | Qty: 24 | Fill #1

## 2017-12-12 MED FILL — LEVONOR-ETH ESTRAD 0.1-0.02: 0.1-20 | 84 days supply | Qty: 112 | Fill #1

## 2018-03-06 MED FILL — SUMAtriptan SUCCINATE 100 M: 100 | 90 days supply | Qty: 24 | Fill #2

## 2018-03-06 MED FILL — LEVONOR-ETH ESTRAD 0.1-0.02: 0.1-20 | 84 days supply | Qty: 112 | Fill #2

## 2018-03-28 IMAGING — MG MM DIGITAL DIAGNOSTIC BILAT W/ TOMO W/ CAD
8 of 17 series · 8 of 40 positions shown · non-contrast
Comparison: Previous exam(s).

CLINICAL DATA: Patient recalled from screening for possible right
breast mass and left breast distortion.

EXAM:
2D DIGITAL DIAGNOSTIC BILATERAL MAMMOGRAM WITH CAD AND ADJUNCT TOMO
ULTRASOUND BILATERAL BREAST

[R ML synth-2D]
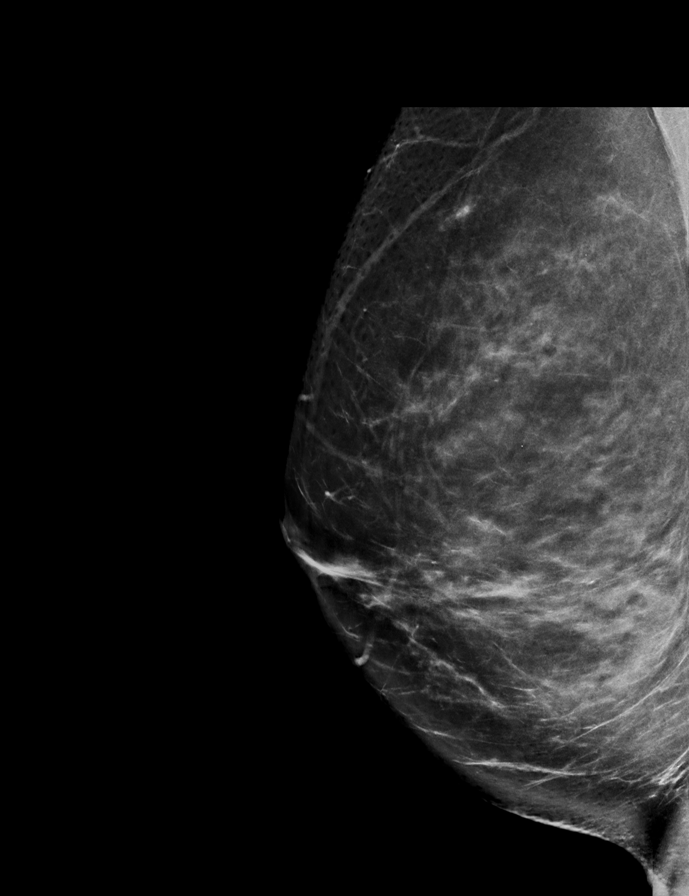

[L MLO synth-2D (1 of 2)]
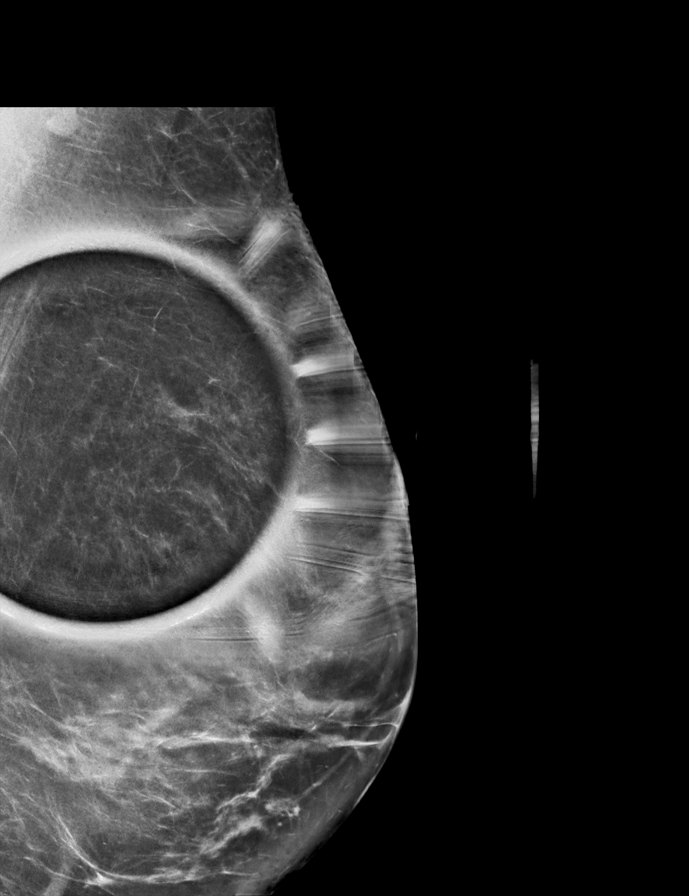

[L ML]
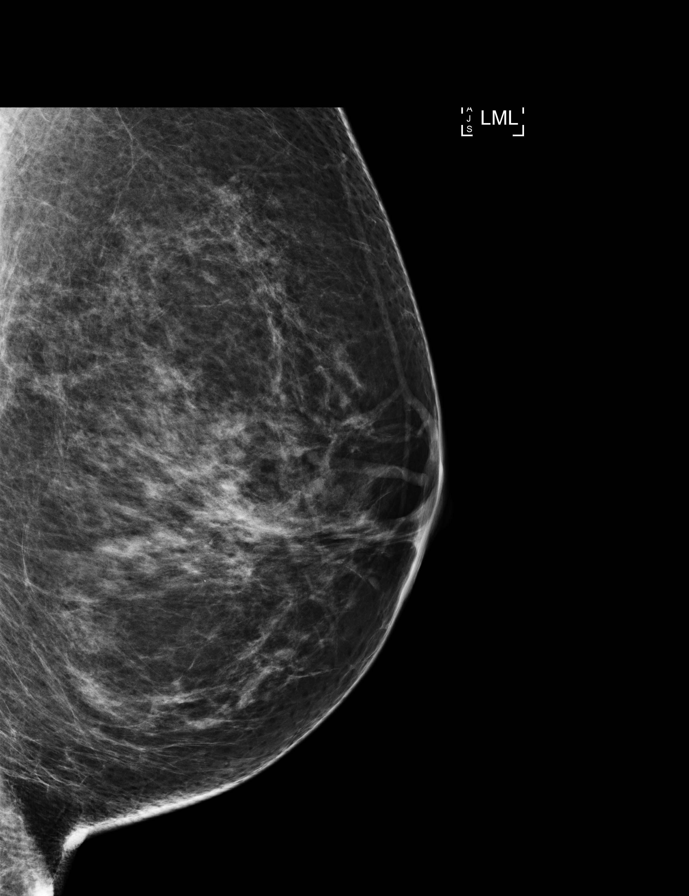

[L MLO synth-2D (2 of 2)]
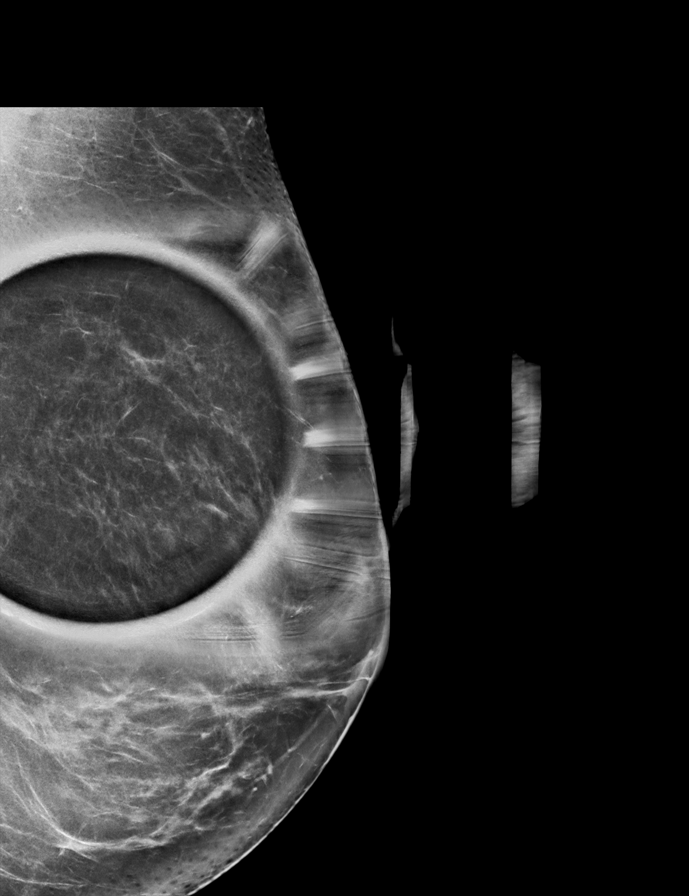

[L MLO]
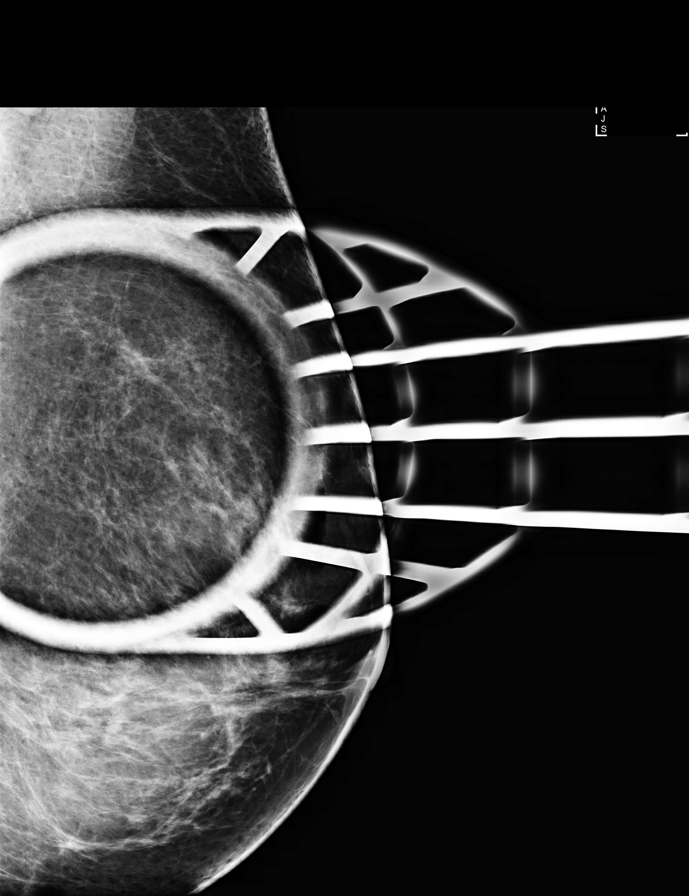

[L ML synth-2D]
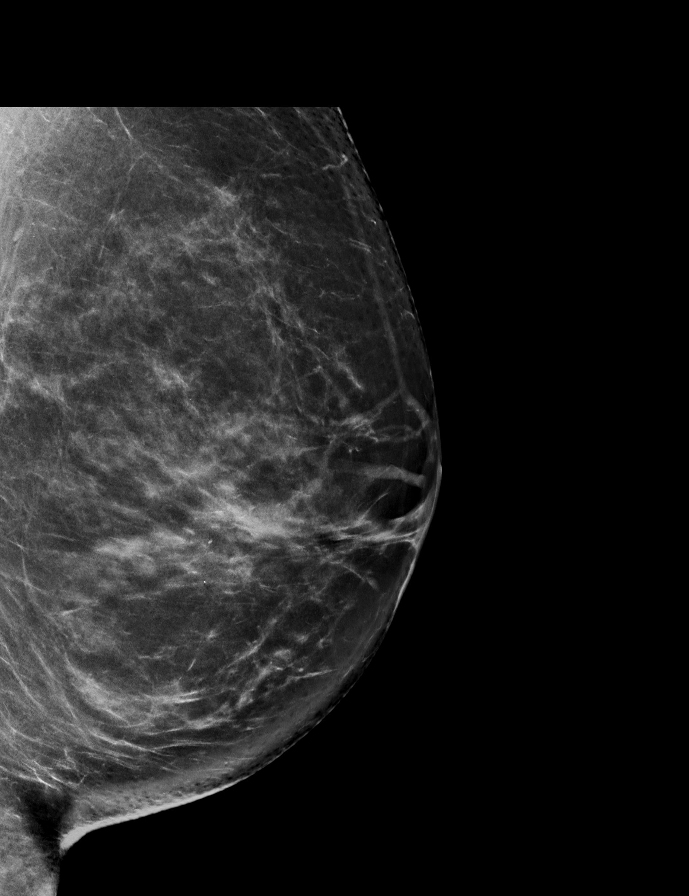

[R CC]
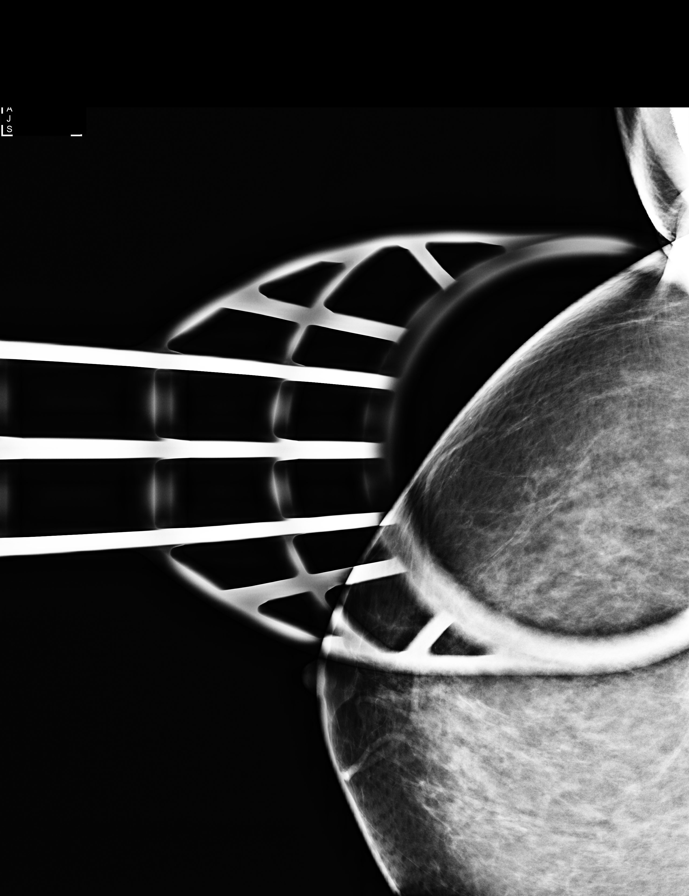

[L CC]
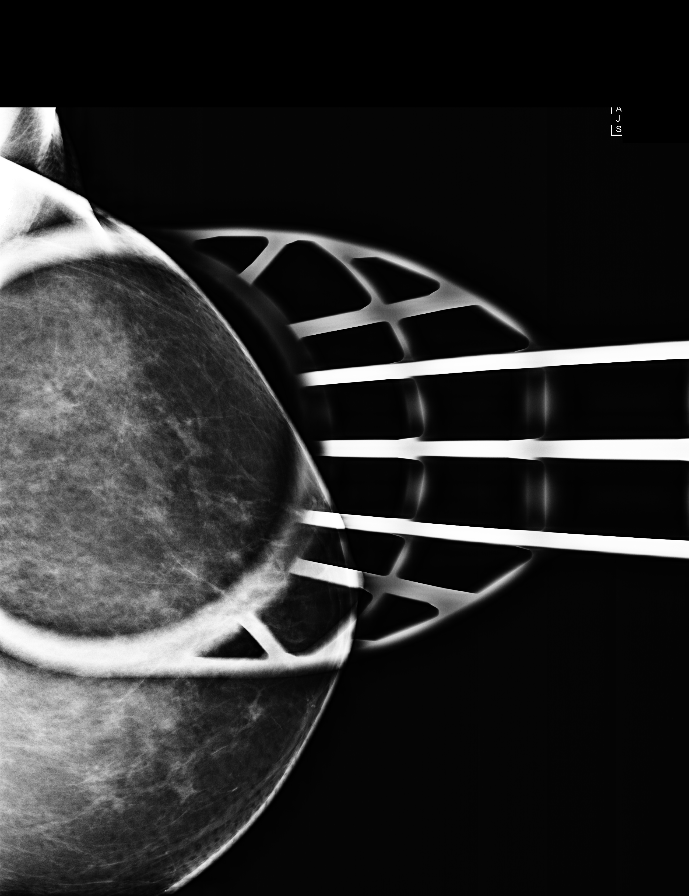

[8 of 40 positions shown; findings below may reference images not displayed]

ACR Breast Density Category c: The breast tissue is heterogeneously
dense, which may obscure small masses.
FINDINGS: Spot compression CC and MLO tomosynthesis images as well as full
paddle true lateral tomosynthesis images of the left breast were
obtained. Spot compression CC tomosynthesis and full paddle true
lateral tomosynthesis images of the right breast were obtained. The
questioned mass within the lateral aspect of the right breast
appeared to resolve with additional imaging.

Questioned distortion within the lateral aspect of the left breast
is less conspicuous on additional images however there is a
persistent focal asymmetry laterally.

Mammographic images were processed with CAD.

On physical exam, I palpate no discrete mass within the lateral
right were lateral left breast.

Targeted ultrasound is performed, showing no suspicious abnormality
within the lateral aspect of the right breast. Within the left
breast 330 o'clock 4 cm from the nipple there is a 1.5 x 1.9 cm
focal area of increased echogenicity with irregularity and posterior
acoustic shadowing. No left axillary adenopathy.
IMPRESSION: Suspicious masslike area of increased echogenicity with posterior
acoustic shadowing within the lateral left breast felt to correspond
with the persistent asymmetric density along the lateral aspect of
the left breast.

No mammographic evidence of malignancy within the right breast.

RECOMMENDATION:
Ultrasound-guided core needle biopsy masslike area of increased
echogenicity lateral left breast.

I have discussed the findings and recommendations with the patient.
Results were also provided in writing at the conclusion of the
visit. If applicable, a reminder letter will be sent to the patient
regarding the next appointment.

BI-RADS CATEGORY  4: Suspicious.

## 2018-05-13 ENCOUNTER — Encounter: Payer: Self-pay | Admitting: Family Medicine

## 2018-05-13 ENCOUNTER — Ambulatory Visit (INDEPENDENT_AMBULATORY_CARE_PROVIDER_SITE_OTHER): Payer: 59 | Admitting: Family Medicine

## 2018-05-13 VITALS — BP 124/84 | HR 81 | Resp 16 | Ht 65.0 in | Wt 136.0 lb

## 2018-05-13 DIAGNOSIS — Z809 Family history of malignant neoplasm, unspecified: Secondary | ICD-10-CM

## 2018-05-13 DIAGNOSIS — Z0001 Encounter for general adult medical examination with abnormal findings: Secondary | ICD-10-CM | POA: Diagnosis not present

## 2018-05-13 DIAGNOSIS — G43009 Migraine without aura, not intractable, without status migrainosus: Secondary | ICD-10-CM | POA: Diagnosis not present

## 2018-05-13 DIAGNOSIS — Z7189 Other specified counseling: Secondary | ICD-10-CM

## 2018-05-13 MED ORDER — ONDANSETRON HCL 4 MG PO TABS: 4.0000 mg | ORAL_TABLET | Freq: Three times a day (TID) | ORAL | 3 refills | Status: DC | PRN

## 2018-05-13 MED ORDER — SUMATRIPTAN SUCCINATE 100 MG PO TABS: 100.0000 mg | ORAL_TABLET | Freq: Every day | ORAL | 3 refills | Status: DC | PRN

## 2018-05-13 MED FILL — ONDANSETRON HCL 4 MG TABLET: 4 | 10 days supply | Qty: 30 | Fill #0

## 2018-05-13 NOTE — Progress Notes (Signed)
CPE- See plan.  Routine anticipatory guidance given to patient.  See health maintenance.  The possibility exists that previously documented standard health maintenance information may have been brought forward from a previous encounter into this note.  If needed, that same information has been updated to reflect the current situation based on today's encounter.    Tetanus 2014 Flu shot done at work.  pna and shingles not due, dw pt Pap per gyn, 05/2015 normal per patient report Mammogram 2019   DXA not due Living will d/w pt. Husband designated if patient were incapacitated, then her brother [name] if husband incapacitated.  Diet and exercise d/w pt, she in maintenance weight watchers.   HIV screen done at work ~1999.    We talked about her diffuse FH of different cancers.  I offered genetic counseling.  Referred. See orders.   Migraines.  Some relief from imitrex.  She needed zofran for nausea, rxs sent.  D/w pt. reasonable to check basic metabolic panel today.  See orders.  PMH and SH reviewed  Meds, vitals, and allergies reviewed.   ROS: Per HPI.  Unless specifically indicated otherwise in HPI, the patient denies:  General: fever. Eyes: acute vision changes ENT: sore throat Cardiovascular: chest pain Respiratory: SOB GI: vomiting GU: dysuria Musculoskeletal: acute back pain Derm: acute rash Neuro: acute motor dysfunction Psych: worsening mood Endocrine: polydipsia Heme: bleeding Allergy: hayfever  GEN: nad, alert and oriented HEENT: mucous membranes moist NECK: supple w/o LA CV: rrr. PULM: ctab, no inc wob ABD: soft, +bs EXT: no edema SKIN: no acute rash

## 2018-05-13 NOTE — Assessment & Plan Note (Signed)
Some relief from imitrex.  She needed zofran for nausea, rxs sent.  D/w pt.

## 2018-05-13 NOTE — Assessment & Plan Note (Signed)
Living will d/w pt. Husband designated if patient were incapacitated, then her brother [name] if husband incapacitated.

## 2018-05-13 NOTE — Assessment & Plan Note (Signed)
Tetanus 2014 Flu shot done at work.  pna and shingles not due, dw pt Pap per gyn, 05/2015 normal per patient report Mammogram 2019   DXA not due Living will d/w pt. Husband designated if patient were incapacitated, then her brother [name] if husband incapacitated.  Diet and exercise d/w pt, she in maintenance weight watchers.   HIV screen done at work ~1999.

## 2018-05-13 NOTE — Patient Instructions (Signed)
Go to the lab on the way out.  We'll contact you with your lab report. Don't change your meds for now.   We make arrangements for referrals, extra imaging, and other appointments based on the urgency of the situation. Referrals are handled based on the clinical situation, not in the order that they are placed. If you do not see one of our referral coordinators on the way out of the clinic today, then you should expect a call in the next 1 to 2 weeks. We work diligently to process all referrals as quickly as possible.    Update me as needed.

## 2018-05-14 LAB — BASIC METABOLIC PANEL
BUN: 12 mg/dL (ref 6–23)
CO2: 28 mEq/L (ref 19–32)
Calcium: 9.4 mg/dL (ref 8.4–10.5)
Chloride: 103 mEq/L (ref 96–112)
Creatinine, Ser: 0.69 mg/dL (ref 0.40–1.20)
GFR: 95.85 mL/min (ref >60.00)
Glucose, Bld: 88 mg/dL (ref 70–99)
Potassium: 4.3 mEq/L (ref 3.5–5.1)
SODIUM: 139 meq/L (ref 135–145)

## 2018-05-27 MED FILL — SUMAtriptan SUCCINATE 100 M: 100 | 90 days supply | Qty: 24 | Fill #0

## 2018-06-03 MED FILL — LEVONOR-ETH ESTRAD 0.1-0.02: 0.1-20 | 84 days supply | Qty: 112 | Fill #0

## 2018-07-11 ENCOUNTER — Encounter: Payer: Self-pay | Admitting: Genetic Counselor

## 2018-07-11 ENCOUNTER — Telehealth: Payer: Self-pay | Admitting: Genetic Counselor

## 2018-07-11 NOTE — Telephone Encounter (Signed)
A genetic counseling appt has been scheduled for the pt to see Roma Kayser on 3/18 at 10am. Letter mailed to the pt.

## 2018-08-05 ENCOUNTER — Telehealth: Payer: Self-pay | Admitting: Genetic Counselor

## 2018-08-05 NOTE — Telephone Encounter (Signed)
LM on home and work numbers to please call about her appointment

## 2018-08-06 NOTE — Telephone Encounter (Signed)
R/s appointment for 5/18

## 2018-08-07 ENCOUNTER — Inpatient Hospital Stay: Payer: 59

## 2018-08-07 ENCOUNTER — Inpatient Hospital Stay: Payer: 59 | Admitting: Genetic Counselor

## 2018-08-12 DIAGNOSIS — Z01419 Encounter for gynecological examination (general) (routine) without abnormal findings: Secondary | ICD-10-CM | POA: Diagnosis not present

## 2018-08-13 MED FILL — SUMATRIPTAN SUCC 100 MG TAB: 100 | 90 days supply | Qty: 24 | Fill #0

## 2018-08-13 MED FILL — LARISSIA 0.1-20 MG-MCG TABS: 0.1-20 | 84 days supply | Qty: 112 | Fill #0

## 2018-10-04 ENCOUNTER — Telehealth: Payer: Self-pay | Admitting: Genetic Counselor

## 2018-10-04 NOTE — Telephone Encounter (Signed)
Called patient regarding upcoming [facility] appointment, left patient a voicemail and e-mail has been sent.

## 2018-10-07 ENCOUNTER — Inpatient Hospital Stay: Payer: 59

## 2018-10-07 ENCOUNTER — Encounter: Payer: Self-pay | Admitting: Genetic Counselor

## 2018-10-07 ENCOUNTER — Inpatient Hospital Stay: Payer: 59 | Attending: Genetic Counselor | Admitting: Genetic Counselor

## 2018-10-07 DIAGNOSIS — Z8042 Family history of malignant neoplasm of prostate: Secondary | ICD-10-CM

## 2018-10-07 DIAGNOSIS — Z800 Family history of malignant neoplasm of digestive organs: Secondary | ICD-10-CM

## 2018-10-07 DIAGNOSIS — Z803 Family history of malignant neoplasm of breast: Secondary | ICD-10-CM | POA: Diagnosis not present

## 2018-10-07 DIAGNOSIS — Z1379 Encounter for other screening for genetic and chromosomal anomalies: Secondary | ICD-10-CM

## 2018-10-07 NOTE — Progress Notes (Signed)
REFERRING PROVIDER: Tonia Ghent, MD [address] [place], [place] [address]  PRIMARY PROVIDER:  Tonia Ghent, MD  PRIMARY REASON FOR VISIT:  1. Family history of breast cancer   2. Family history of prostate cancer   3. Family history of pancreatic cancer   4. Family history of colon cancer      HISTORY OF PRESENT ILLNESS:   Gina Fleming, a 48 y.o. female, was seen for a [facility] cancer genetics consultation at the request of Dr. Damita Dunnings due to a family history of breast, colon, prostate and pancreatic cancer.  Gina Fleming presents to clinic today to discuss the possibility of a hereditary predisposition to cancer, genetic testing, and to further clarify her future cancer risks, as well as potential cancer risks for family members.   Gina Fleming is a 48 y.o. female with no personal history of cancer.    CANCER HISTORY:   No history exists.     RISK FACTORS:  Menarche was at age 21.  First live birth at age N/A.  Ovaries intact: yes.  Hysterectomy: no.  Menopausal status: perimenopausal.  HRT use: 0 years. Colonoscopy: yes; normal. Mammogram within the last year: yes. Number of breast biopsies: 1. Up to date with pelvic exams: yes. Any excessive radiation exposure in the past: no  Past Medical History:  Diagnosis Date   Anemia    H/O    Family history of breast cancer    Family history of colon cancer    Family history of pancreatic cancer    Family history of prostate cancer    Migraine without aura    Nephrolithiasis    H/O STONES    Past Surgical History:  Procedure Laterality Date   BREAST BIOPSY Left 09/07/2015   US guided biopsy w/ clip, papillomas   BREAST LUMPECTOMY Left 10/15/2015   papillomas USUAL DUCTAL HYPERPLASIA, COLUMNAR CELL CHANGE, SCLEROSING ADENOSIS,    BREAST LUMPECTOMY WITH NEEDLE LOCALIZATION Left 10/15/2015   Procedure: BREAST LUMPECTOMY WITH NEEDLE LOCALIZATION;  Surgeon: Leonie Green, MD;  Location: [facility] ORS;   Service: General;  Laterality: Left;   WISDOM TOOTH EXTRACTION      Social History   Socioeconomic History   Marital status: Married    Spouse name: Not on file   Number of children: Not on file   Years of education: Not on file   Highest education level: Not on file  Occupational History   Not on file  Social Needs   Financial resource strain: Not on file   Food insecurity:    Worry: Not on file    Inability: Not on file   Transportation needs:    Medical: Not on file    Non-medical: Not on file  Tobacco Use   Smoking status: Never Smoker   Smokeless tobacco: Never Used  Substance and Sexual Activity   Alcohol use: Yes    Comment: rare   Drug use: No   Sexual activity: Not on file  Lifestyle   Physical activity:    Days per week: Not on file    Minutes per session: Not on file   Stress: Not on file  Relationships   Social connections:    Talks on phone: Not on file    Gets together: Not on file    Attends religious service: Not on file    Active member of club or organization: Not on file    Attends meetings of clubs or organizations: Not on file  Relationship status: Not on file  Other Topics Concern   Not on file  Social History Narrative   Married 2003   No kids   2 cats   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  Significant diagnoses are listed below: Family History  Problem Relation Age of Onset   Cancer Mother 28       unknow primary cancer   Skin cancer Father    Prostate cancer Father 67   Heart disease Father    Cancer Maternal Uncle 1       eye cancer   Colon cancer Maternal Grandmother    Breast cancer Maternal Grandmother        over the age of 67   Pancreatic cancer Paternal Grandmother 65   Alcohol abuse Daughter    Breast cancer Half-Sister 74   Alcohol abuse  Half-Brother     The patient does not have children.  She has a full brother and sister, and two paternal half brothers and two paternal half sisters. One half sister had breast cancer at 84.  Her parents are both deceased.  The patient's mother had carcinoma of an unknown primary at age 97.  She had a twin brother who had an unilateral eye cancer at age 71.  The maternal grandmother had breast and colon cancer over 34,    The patient's father had prostate cancer at 32.  He had two brothers and three sisters who are reportedly cancer free.  His mother had pancreatic cancer and his father died of non cancer related issues.  Gina Fleming is unaware of previous family history of genetic testing for hereditary cancer risks. Patient's maternal ancestors are of [place], [place] and [place] descent, and paternal ancestors are of [place], [place] and [place] descent. There is no reported Ashkenazi Jewish ancestry. There is no known consanguinity.  GENETIC COUNSELING ASSESSMENT: Gina Fleming is a 48 y.o. female with a family history of breast, prostate, colon and pancreatic cancer which is somewhat suggestive of a hereditary cancer syndrome and predisposition to cancer. We, therefore, discussed and recommended the following at today's visit.   DISCUSSION: We discussed that 5 - 10% of breast cancer is hereditary, with most cases associated with BRCA mutations.  There are other genes that can be associated with hereditary breast cancer syndromes.  These include ATM, CHEK2 and other cancer genes.  We discussed that testing is beneficial for several reasons including knowing how to follow individuals after completing their treatment, identifying whether potential treatment options such as PARP inhibitors would be beneficial, and understand if other family members could be at risk for cancer and allow them to undergo genetic testing.   We reviewed the characteristics, features and inheritance patterns of hereditary cancer  syndromes. We also discussed genetic testing, including the appropriate family members to test, the process of testing, insurance coverage and turn-around-time for results. We discussed the implications of a negative, positive and/or variant of uncertain significant result. We recommended Gina Fleming pursue genetic testing for the multi cancer gene panel. The Multi-Gene Panel offered by [facility] includes sequencing and/or deletion duplication testing of the following 85 genes: AIP, ALK, APC, ATM, AXIN2,BAP1,  BARD1, BLM, BMPR1A, BRCA1, BRCA2, BRIP1, CASR, CDC73, CDH1, CDK4, CDKN1B, CDKN1C, CDKN2A (p14ARF), CDKN2A (p16INK4a), CEBPA, CHEK2, CTNNA1, DICER1, DIS3L2, EGFR (c.2369C>T, p.Thr790Met variant only), EPCAM (Deletion/duplication  testing only), FH, FLCN, GATA2, GPC3, GREM1 (Promoter region deletion/duplication testing only), HOXB13 (c.251G>A, p.Gly84Glu), HRAS, KIT, MAX, MEN1, MET, MITF (c.952G>A, p.Glu318Lys variant only), MLH1, MSH2, MSH3, MSH6, MUTYH, NBN, NF1, NF2, NTHL1, PALB2, PDGFRA, PHOX2B, PMS2, POLD1, POLE, POT1, PRKAR1A, PTCH1, PTEN, RAD50, RAD51C, RAD51D, RB1, RECQL4, RET, RNF43, RUNX1, SDHAF2, SDHA (sequence changes only), SDHB, SDHC, SDHD, SMAD4, SMARCA4, SMARCB1, SMARCE1, STK11, SUFU, TERC, TERT, TMEM127, TP53, TSC1, TSC2, VHL, WRN and WT1.    Based on Gina Fleming's family history of cancer, she meets medical criteria for genetic testing. Despite that she meets criteria, she may still have an out of pocket cost. We discussed that if her out of pocket cost for testing is over $100, the laboratory will call and confirm whether she wants to proceed with testing.  If the out of pocket cost of testing is less than $100 she will be billed by the genetic testing laboratory.   PLAN: After considering the risks, benefits, and limitations, Gina Fleming provided informed consent to pursue genetic testing.  A saliva kit has been ordered and will be sent to her home.  She will need to spit in the tube, 30 minutes  after eating, drinking, chewing gum or smoking.  The sample will be sent to [facility] for analysis of the multi-cancer panel. Results should be available within approximately 2-3 weeks' time, at which point they will be disclosed by telephone to Gina Fleming, as will any additional recommendations warranted by these results. Gina Fleming will receive a summary of her genetic counseling visit and a copy of her results once available. This information will also be available in Epic.   Based on Gina Fleming's family history, we recommended her sister, who was diagnosed with breast cancer at age 15, have genetic counseling and testing. Gina Fleming will let us know if we can be of any assistance in coordinating genetic counseling and/or testing for this family member.   Lastly, we encouraged Gina Fleming to remain in contact with cancer genetics annually so that we can continuously update the family history and inform her of any changes in cancer genetics and testing that may be of benefit for this family.   Gina Fleming's questions were answered to her satisfaction today. Our contact information was provided should additional questions or concerns arise. Thank you for the referral and allowing [place] to share in the care of your patient.   Gina Fleming, [place], [facility] Certified Genetic Counselor [email or web address] phone: [phone]  The patient was seen for a total of 47 minutes in face-to-face genetic counseling.  This patient was discussed with Drs. Magrinat, Lindi Adie and/or Burr Medico who agrees with the above.    _______________________________________________________________________ For Office Staff:  Number of people involved in session: 1 Was an Intern/ student involved with case: no

## 2018-10-10 ENCOUNTER — Other Ambulatory Visit: Payer: Self-pay | Admitting: Family Medicine

## 2018-10-10 DIAGNOSIS — Z1231 Encounter for screening mammogram for malignant neoplasm of breast: Secondary | ICD-10-CM

## 2018-10-10 DIAGNOSIS — Z803 Family history of malignant neoplasm of breast: Secondary | ICD-10-CM | POA: Diagnosis not present

## 2018-10-10 DIAGNOSIS — Z8042 Family history of malignant neoplasm of prostate: Secondary | ICD-10-CM | POA: Diagnosis not present

## 2018-10-10 DIAGNOSIS — Z800 Family history of malignant neoplasm of digestive organs: Secondary | ICD-10-CM | POA: Diagnosis not present

## 2018-10-21 ENCOUNTER — Telehealth: Payer: Self-pay | Admitting: Genetic Counselor

## 2018-10-21 ENCOUNTER — Ambulatory Visit: Payer: Self-pay | Admitting: Genetic Counselor

## 2018-10-21 DIAGNOSIS — Z1379 Encounter for other screening for genetic and chromosomal anomalies: Secondary | ICD-10-CM | POA: Insufficient documentation

## 2018-10-21 NOTE — Telephone Encounter (Signed)
Revealed negative genetic testing.  Discussed that we do not know why there is cancer in the family. It could be due to a different gene that we are not testing, or maybe our current technology may not be able to pick something up.  It will be important for her to keep in contact with genetics to keep up with whether additional testing may be needed.  

## 2018-10-21 NOTE — Progress Notes (Signed)
HPI:  Gina Fleming was previously seen in the [facility] clinic due to a family history of cancer and concerns regarding a hereditary predisposition to cancer. Please refer to our prior cancer genetics clinic note for more information regarding our discussion, assessment and recommendations, at the time. Gina Fleming recent genetic test results were disclosed to her, as were recommendations warranted by these results. These results and recommendations are discussed in more detail below.  CANCER HISTORY:   No history exists.    FAMILY HISTORY:  We obtained a detailed, 4-generation family history.  Significant diagnoses are listed below: Family History  Problem Relation Age of Onset   Cancer Mother 7       unknow primary cancer   Skin cancer Father    Prostate cancer Father 2   Heart disease Father    Cancer Maternal Uncle 1       eye cancer   Colon cancer Maternal Grandmother    Breast cancer Maternal Grandmother        over the age of 16   Pancreatic cancer Paternal Grandmother 40   Alcohol abuse Daughter    Breast cancer Half-Sister 43   Alcohol abuse Half-Brother     The patient does not have children.  She has a full brother and sister, and two paternal half brothers and two paternal half sisters. One half sister had breast cancer at 35.  Her parents are both deceased.  The patient's mother had carcinoma of an unknown primary at age 85.  She had a twin brother who had an unilateral eye cancer at age 10.  The maternal grandmother had breast and colon cancer over 60,    The patient's father had prostate cancer at 68.  He had two brothers and three sisters who are reportedly cancer free.  His mother had pancreatic cancer and his father died of non cancer related issues.  Gina Fleming is unaware of previous family history of genetic testing for hereditary cancer risks. Patient's maternal ancestors are of [place], [place] and [place] descent, and paternal ancestors  are of [place], [place] and [place] descent. There is no reported Ashkenazi Jewish ancestry. There is no known consanguinity.    GENETIC TEST RESULTS: Genetic testing reported out on October 21, 2018 through the multi cancer panel found no pathogenic mutations. The Multi-Gene Panel offered by [facility] includes sequencing and/or deletion duplication testing of the following 85 genes: AIP, ALK, APC, ATM, AXIN2,BAP1,  BARD1, BLM, BMPR1A, BRCA1, BRCA2, BRIP1, CASR, CDC73, CDH1, CDK4, CDKN1B, CDKN1C, CDKN2A (p14ARF), CDKN2A (p16INK4a), CEBPA, CHEK2, CTNNA1, DICER1, DIS3L2, EGFR (c.2369C>T, p.Thr790Met variant only), EPCAM (Deletion/duplication testing only), FH, FLCN, GATA2, GPC3, GREM1 (Promoter region deletion/duplication testing only), HOXB13 (c.251G>A, p.Gly84Glu), HRAS, KIT, MAX, MEN1, MET, MITF (c.952G>A, p.Glu318Lys variant only), MLH1, MSH2, MSH3, MSH6, MUTYH, NBN, NF1, NF2, NTHL1, PALB2, PDGFRA, PHOX2B, PMS2, POLD1, POLE, POT1, PRKAR1A, PTCH1, PTEN, RAD50, RAD51C, RAD51D, RB1, RECQL4, RET, RNF43, RUNX1, SDHAF2, SDHA (sequence changes only), SDHB, SDHC, SDHD, SMAD4, SMARCA4, SMARCB1, SMARCE1, STK11, SUFU, TERC, TERT, TMEM127, TP53, TSC1, TSC2, VHL, WRN and WT1.  The test report has been scanned into EPIC and is located under the Molecular Pathology section of the Results Review tab.  A portion of the result report is included below for reference.     We discussed with Gina Fleming that because current genetic testing is not perfect, it is possible there may be a gene mutation in one of these genes that current testing cannot detect, but that chance is  small.  We also discussed, that there could be another gene that has not yet been discovered, or that we have not yet tested, that is responsible for the cancer diagnoses in the family. It is also possible there is a hereditary cause for the cancer in the family that Gina Fleming did not inherit and therefore was not identified in her testing.  Therefore, it is important  to remain in touch with cancer genetics in the future so that we can continue to offer Gina Fleming the most up to date genetic testing.   ADDITIONAL GENETIC TESTING: We discussed with Gina Fleming that there are other genes that are associated with increased cancer risk that can be analyzed. Should Gina Fleming wish to pursue additional genetic testing, we are happy to discuss and coordinate this testing, at any time.    CANCER SCREENING RECOMMENDATIONS: Gina Fleming's test result is considered negative (normal).  This means that we have not identified a hereditary cause for her family history of cancer at this time. Most cancers happen by chance and this negative test suggests that her cancer may fall into this category.    While reassuring, this does not definitively rule out a hereditary predisposition to cancer. It is still possible that there could be genetic mutations that are undetectable by current technology. There could be genetic mutations in genes that have not been tested or identified to increase cancer risk.  Therefore, it is recommended she continue to follow the cancer management and screening guidelines provided by her and primary healthcare provider.   An individual's cancer risk and medical management are not determined by genetic test results alone. Overall cancer risk assessment incorporates additional factors, including personal medical history, family history, and any available genetic information that may result in a personalized plan for cancer prevention and surveillance  RECOMMENDATIONS FOR FAMILY MEMBERS:  Individuals in this family might be at some increased risk of developing cancer, over the general population risk, simply due to the family history of cancer.  We recommended women in this family have a yearly mammogram beginning at age 32, or 46 years younger than the earliest onset of cancer, an annual clinical breast exam, and perform monthly breast self-exams. Women in this family  should also have a gynecological exam as recommended by their primary provider. All family members should have a colonoscopy by age 53.  It is also possible there is a hereditary cause for the cancer in Gina Fleming's family that she did not inherit and therefore was not identified in her.  Based on Gina Fleming's family history, we recommended her sister, who was diagnosed with breast cancer at age 76, have genetic counseling and testing. Gina Fleming will let us know if we can be of any assistance in coordinating genetic counseling and/or testing for this family member.   FOLLOW-UP: Lastly, we discussed with Gina Fleming that cancer genetics is a rapidly advancing field and it is possible that new genetic tests will be appropriate for her and/or her family members in the future. We encouraged her to remain in contact with cancer genetics on an annual basis so we can update her personal and family histories and let her know of advances in cancer genetics that may benefit this family.   Our contact number was provided. Gina Fleming's questions were answered to her satisfaction, and she knows she is welcome to call us at anytime with additional questions or concerns.   Roma Kayser, MS, [facility], [facility], [facility] Certified Genetic Counselor [email or web address]

## 2018-11-21 ENCOUNTER — Telehealth: Payer: Self-pay | Admitting: Family Medicine

## 2018-11-21 ENCOUNTER — Ambulatory Visit
Admission: RE | Admit: 2018-11-21 | Discharge: 2018-11-21 | Disposition: A | Discharge status: Home / Self Care | Payer: 59 | Source: Ambulatory Visit | Attending: Family Medicine | Admitting: Family Medicine

## 2018-11-21 ENCOUNTER — Other Ambulatory Visit: Payer: Self-pay

## 2018-11-21 DIAGNOSIS — Z1231 Encounter for screening mammogram for malignant neoplasm of breast: Secondary | ICD-10-CM | POA: Insufficient documentation

## 2018-11-21 DIAGNOSIS — Z1211 Encounter for screening for malignant neoplasm of colon: Secondary | ICD-10-CM

## 2018-11-21 NOTE — Telephone Encounter (Signed)
Please review

## 2018-11-21 NOTE — Telephone Encounter (Signed)
Patient just turned 48 and wants to be referred for a Colonoscopy.  Patient wants to be referred to [facility].  Patient wants it scheduled on 12/24/18.

## 2018-11-23 NOTE — Telephone Encounter (Signed)
I don't know when she can be scheduled, but I put in the referral.  Thanks.

## 2018-11-25 ENCOUNTER — Telehealth: Payer: Self-pay

## 2018-11-25 ENCOUNTER — Other Ambulatory Visit: Payer: Self-pay

## 2018-11-25 DIAGNOSIS — Z1211 Encounter for screening for malignant neoplasm of colon: Secondary | ICD-10-CM

## 2018-11-25 NOTE — Telephone Encounter (Signed)
Gastroenterology Pre-Procedure Review  Request Date: 12/24/18 Requesting Physician: Dr. Vicente Males  PATIENT REVIEW QUESTIONS: The patient responded to the following health history questions as indicated:    1. Are you having any GI issues? no 2. Do you have a personal history of Polyps? no 3. Do you have a family history of Colon Cancer or Polyps? yes (MATERNAL GRANDMOTHER COLON CANCER) 4. Diabetes Mellitus? no 5. Joint replacements in the past 12 months?no 6. Major health problems in the past 3 months?no 7. Any artificial heart valves, MVP, or defibrillator?no    MEDICATIONS & ALLERGIES:    Patient reports the following regarding taking any anticoagulation/antiplatelet therapy:   Plavix, Coumadin, Eliquis, Xarelto, Lovenox, Pradaxa, Brilinta, or Effient? no Aspirin? no  Patient confirms/reports the following medications:  Current Outpatient Medications  Medication Sig Dispense Refill  . cetirizine (ZYRTEC) 10 MG tablet Take 10 mg by mouth as needed for allergies.    . cyclobenzaprine (FLEXERIL) 10 MG tablet Take 0.5-1 tablets (5-10 mg total) by mouth 3 (three) times daily as needed (for migraine.). Sedation caution 30 tablet 3  . levonorgestrel-ethinyl estradiol (AVIANE,ALESSE,LESSINA) 0.1-20 MG-MCG tablet Take 1 tablet by mouth daily.    . ondansetron (ZOFRAN) 4 MG tablet Take 1 tablet (4 mg total) by mouth every 8 (eight) hours as needed for nausea or vomiting. 30 tablet 3  . SUMAtriptan (IMITREX) 100 MG tablet Take 1 tablet (100 mg total) by mouth daily as needed for migraine. May repeat in 2 hours if headache persists or recurs. 24 tablet 3   No current facility-administered medications for this visit.     Patient confirms/reports the following allergies:  Allergies  Allergen Reactions  . Topamax [Topiramate] Other (See Comments)    Kidney stones while on med    No orders of the defined types were placed in this encounter.   AUTHORIZATION INFORMATION Primary  Insurance: 1D#: Group #:  Secondary Insurance: 1D#: Group #:  SCHEDULE INFORMATION: Date:12/20/18  Time: Location:[facility]

## 2018-12-02 ENCOUNTER — Telehealth: Payer: Self-pay | Admitting: Gastroenterology

## 2018-12-02 NOTE — Telephone Encounter (Signed)
Patient called & l/m to see if she could have her covid test time changed or day on 12-20-18. She has two appts. That day. Her colonoscopy in on 12-24-18.

## 2018-12-04 MED FILL — SUPREP BOWEL PREP KIT: 17.5-3.13-1 | 1 days supply | Qty: 354 | Fill #0

## 2018-12-04 MED FILL — LARISSIA 0.1-20 MG-MCG TABS: 0.1-20 | 84 days supply | Qty: 112 | Fill #0

## 2018-12-04 MED FILL — SUMAtriptan SUCCINATE 100 M: 100 | 90 days supply | Qty: 24 | Fill #0

## 2018-12-04 NOTE — Telephone Encounter (Signed)
Returned patients call regarding COVID test.  She has an appt in [place] and will not be able to get testing done between the designated time range 10:30am and 12:30pm, however she said she will be able to get back in [place] by 1:30.  I informed her that this would be fine.  I lvm with pre-admit testing to make them aware of the patient.  Thanks [employer]

## 2018-12-20 ENCOUNTER — Other Ambulatory Visit
Admission: RE | Admit: 2018-12-20 | Discharge: 2018-12-20 | Disposition: A | Discharge status: Home / Self Care | Payer: 59 | Source: Ambulatory Visit | Attending: Gastroenterology | Admitting: Gastroenterology

## 2018-12-20 ENCOUNTER — Other Ambulatory Visit: Payer: Self-pay

## 2018-12-20 DIAGNOSIS — Z20828 Contact with and (suspected) exposure to other viral communicable diseases: Secondary | ICD-10-CM | POA: Diagnosis not present

## 2018-12-20 DIAGNOSIS — Z01812 Encounter for preprocedural laboratory examination: Secondary | ICD-10-CM | POA: Diagnosis not present

## 2018-12-20 DIAGNOSIS — N912 Amenorrhea, unspecified: Secondary | ICD-10-CM | POA: Diagnosis not present

## 2018-12-21 LAB — SARS CORONAVIRUS 2 (TAT 6-24 HRS): SARS Coronavirus 2: NEGATIVE

## 2018-12-24 ENCOUNTER — Ambulatory Visit: Payer: 59 | Admitting: Certified Registered"

## 2018-12-24 ENCOUNTER — Encounter
Admission: RE | Disposition: A | Discharge status: Home / Self Care | Payer: Self-pay | Source: Home / Self Care | Attending: Gastroenterology

## 2018-12-24 ENCOUNTER — Ambulatory Visit
Admission: RE | Admit: 2018-12-24 | Discharge: 2018-12-24 | Disposition: A | Discharge status: Home / Self Care | Payer: 59 | Attending: Gastroenterology | Admitting: Gastroenterology

## 2018-12-24 DIAGNOSIS — Z793 Long term (current) use of hormonal contraceptives: Secondary | ICD-10-CM | POA: Diagnosis not present

## 2018-12-24 DIAGNOSIS — Z1211 Encounter for screening for malignant neoplasm of colon: Secondary | ICD-10-CM | POA: Diagnosis not present

## 2018-12-24 DIAGNOSIS — G43909 Migraine, unspecified, not intractable, without status migrainosus: Secondary | ICD-10-CM | POA: Insufficient documentation

## 2018-12-24 HISTORY — PX: COLONOSCOPY WITH PROPOFOL: SHX5780

## 2018-12-24 LAB — POCT PREGNANCY, URINE: Preg Test, Ur: NEGATIVE

## 2018-12-24 SURGERY — COLONOSCOPY WITH PROPOFOL
Anesthesia: General

## 2018-12-24 MED ORDER — SODIUM CHLORIDE 0.9 % IV SOLN
INTRAVENOUS | Status: DC
  Administered 2018-12-24: 1000 mL via INTRAVENOUS

## 2018-12-24 MED ORDER — PHENYLEPHRINE HCL (PRESSORS) 10 MG/ML IV SOLN
INTRAVENOUS | Status: DC | PRN
  Administered 2018-12-24: 100 ug via INTRAVENOUS

## 2018-12-24 MED ORDER — PROPOFOL 10 MG/ML IV BOLUS
INTRAVENOUS | Status: DC | PRN
  Administered 2018-12-24: 30 mg via INTRAVENOUS
  Administered 2018-12-24: 50 mg via INTRAVENOUS
  Administered 2018-12-24: 30 mg via INTRAVENOUS

## 2018-12-24 NOTE — Anesthesia Preprocedure Evaluation (Addendum)
Anesthesia Evaluation  Patient identified by MRN, date of birth, ID band Patient awake    Reviewed: Allergy & Precautions, NPO status , Patient's Chart, lab work & pertinent test results  History of Anesthesia Complications Negative for: history of anesthetic complications  Airway Mallampati: II  TM Distance: >3 FB Neck ROM: Full    Dental no notable dental hx.    Pulmonary neg pulmonary ROS, neg sleep apnea, neg COPD,    breath sounds clear to auscultation- rhonchi (-) wheezing      Cardiovascular Exercise Tolerance: Good (-) hypertension(-) CAD, (-) Past MI, (-) Cardiac Stents and (-) CABG  Rhythm:Regular Rate:Normal - Systolic murmurs and - Diastolic murmurs    Neuro/Psych  Headaches, neg Seizures negative psych ROS   GI/Hepatic negative GI ROS, Neg liver ROS,   Endo/Other  negative endocrine ROSneg diabetes  Renal/GU Renal InsufficiencyRenal disease     Musculoskeletal negative musculoskeletal ROS (+)   Abdominal (+) - obese,   Peds  Hematology  (+) anemia ,   Anesthesia Other Findings Past Medical History: No date: Anemia     Comment:  H/O  No date: Family history of breast cancer No date: Family history of colon cancer No date: Family history of pancreatic cancer No date: Family history of prostate cancer No date: Migraine without aura No date: Nephrolithiasis     Comment:  H/O STONES   Reproductive/Obstetrics                             Anesthesia Physical Anesthesia Plan  ASA: II  Anesthesia Plan: General   Post-op Pain Management:    Induction: Intravenous  PONV Risk Score and Plan: 2 and Propofol infusion  Airway Management Planned: Natural Airway  Additional Equipment:   Intra-op Plan:   Post-operative Plan:   Informed Consent: I have reviewed the patients History and Physical, chart, labs and discussed the procedure including the risks, benefits and  alternatives for the proposed anesthesia with the patient or authorized representative who has indicated his/her understanding and acceptance.     Dental advisory given  Plan Discussed with: CRNA and Anesthesiologist  Anesthesia Plan Comments:         Anesthesia Quick Evaluation

## 2018-12-24 NOTE — Op Note (Signed)
[facility] Patient Name: Gina Fleming Procedure Date: 12/24/2018 9:17 AM MRN: [id number] Account #: [id number] Date of Birth: Sep 23, 1970 Admit Type: Outpatient Age: 48 Room: [facility] ENDO ROOM 1 Gender: Female Note Status: Finalized Procedure:            Colonoscopy Indications:          Screening for colorectal malignant neoplasm, This is                        the patient's first colonoscopy Providers:            Lin Landsman MD, MD Medicines:            Monitored Anesthesia Care Complications:        No immediate complications. Estimated blood loss: None. Procedure:            Pre-Anesthesia Assessment:                       - Prior to the procedure, a History and Physical was                        performed, and patient medications and allergies were                        reviewed. The patient is competent. The risks and                        benefits of the procedure and the sedation options and                        risks were discussed with the patient. All questions                        were answered and informed consent was obtained.                        Patient identification and proposed procedure were                        verified by the physician, the nurse, the                        anesthesiologist, the anesthetist and the technician in                        the pre-procedure area in the procedure room in the                        endoscopy suite. Mental Status Examination: alert and                        oriented. Airway Examination: normal oropharyngeal                        airway and neck mobility. Respiratory Examination:                        clear to auscultation. CV Examination: normal.  Prophylactic Antibiotics: The patient does not require                        prophylactic antibiotics. Prior Anticoagulants: The                        patient has taken no previous anticoagulant or                        antiplatelet agents. ASA Grade Assessment: II - A                        patient with mild systemic disease. After reviewing the                        risks and benefits, the patient was deemed in                        satisfactory condition to undergo the procedure. The                        anesthesia plan was to use monitored anesthesia care                        (MAC). Immediately prior to administration of                        medications, the patient was re-assessed for adequacy                        to receive sedatives. The heart rate, respiratory rate,                        oxygen saturations, blood pressure, adequacy of                        pulmonary ventilation, and response to care were                        monitored throughout the procedure. The physical status                        of the patient was re-assessed after the procedure.                       After obtaining informed consent, the colonoscope was                        passed under direct vision. Throughout the procedure,                        the patient's blood pressure, pulse, and oxygen                        saturations were monitored continuously. The                        Colonoscope was introduced through the anus and                        advanced  to the the cecum, identified by appendiceal                        orifice and ileocecal valve. The colonoscopy was                        performed without difficulty. The patient tolerated the                        procedure well. The quality of the bowel preparation                        was evaluated using the BBPS [facility] Bowel Preparation                        Scale) with scores of: Right Colon = 3, Transverse                        Colon = 3 and Left Colon = 3 (entire mucosa seen well                        with no residual staining, small fragments of stool or                        opaque liquid). The total BBPS  score equals 9. Findings:      The perianal and digital rectal examinations were normal. Pertinent       negatives include normal sphincter tone and no palpable rectal lesions.      The colon (entire examined portion) appeared normal.      The retroflexed view of the distal rectum and anal verge was normal and       showed no anal or rectal abnormalities.      Copious quantities of liquid stool was found in the entire colon,       precluding visualization. Lavage of the area was performed using 200 -       500 mL of sterile water, resulting in clearance with good visualization. Impression:           - The entire examined colon is normal.                       - The distal rectum and anal verge are normal on                        retroflexion view.                       - Stool in the entire examined colon.                       - No specimens collected. Recommendation:       - Discharge patient to home (with escort).                       - Resume regular diet today.                       - Continue present medications.                       -  Repeat colonoscopy in 10 years for surveillance. Procedure Code(s):    --- Professional ---                       Y2334, Colorectal cancer screening; colonoscopy on                        individual not meeting criteria for high risk Diagnosis Code(s):    --- Professional ---                       Z12.11, Encounter for screening for malignant neoplasm                        of colon CPT copyright 2019 American Medical Association. All rights reserved. The codes documented in this report are preliminary and upon coder review may  be revised to meet current compliance requirements. Dr. Ulyess Mort Lin Landsman MD, MD 12/24/2018 9:41:34 AM This report has been signed electronically. Number of Addenda: 0 Note Initiated On: 12/24/2018 9:17 AM Scope Withdrawal Time: 0 hours 9 minutes 0 seconds  Total Procedure Duration: 0 hours 12 minutes 40  seconds  Estimated Blood Loss: Estimated blood loss: none.      [facility]

## 2018-12-24 NOTE — Anesthesia Post-op Follow-up Note (Signed)
Anesthesia QCDR form completed.        

## 2018-12-24 NOTE — H&P (Signed)
Cephas Darby, MD [address]  [place]  [place], [place] [address]  Main: [phone]  Fax: [phone] Pager: [phone]  Primary Care Physician:  Tonia Ghent, MD Primary Gastroenterologist:  Dr. Cephas Darby  Pre-Procedure History & Physical: HPI:  Gina Fleming is a 48 y.o. female is here for an colonoscopy.   Past Medical History:  Diagnosis Date  . Anemia    H/O   . Family history of breast cancer   . Family history of colon cancer   . Family history of pancreatic cancer   . Family history of prostate cancer   . Migraine without aura   . Nephrolithiasis    H/O STONES    Past Surgical History:  Procedure Laterality Date  . BREAST BIOPSY Left 09/07/2015   US guided biopsy w/ clip, papillomas  . BREAST LUMPECTOMY Left 10/15/2015   papillomas USUAL DUCTAL HYPERPLASIA, COLUMNAR CELL CHANGE, SCLEROSING ADENOSIS,   . BREAST LUMPECTOMY WITH NEEDLE LOCALIZATION Left 10/15/2015   Procedure: BREAST LUMPECTOMY WITH NEEDLE LOCALIZATION;  Surgeon: Leonie Green, MD;  Location: [facility] ORS;  Service: General;  Laterality: Left;  . WISDOM TOOTH EXTRACTION      Prior to Admission medications   Medication Sig Start Date End Date Taking? Authorizing Provider  cetirizine (ZYRTEC) 10 MG tablet Take 10 mg by mouth as needed for allergies.   Yes [provider]  cyclobenzaprine (FLEXERIL) 10 MG tablet Take 0.5-1 tablets (5-10 mg total) by mouth 3 (three) times daily as needed (for migraine.). Sedation caution 04/20/17  Yes Tonia Ghent, MD  levonorgestrel-ethinyl estradiol (AVIANE,ALESSE,LESSINA) 0.1-20 MG-MCG tablet Take 1 tablet by mouth daily.   Yes [provider]  ondansetron (ZOFRAN) 4 MG tablet Take 1 tablet (4 mg total) by mouth every 8 (eight) hours as needed for nausea or vomiting. 05/13/18  Yes Tonia Ghent, MD  SUMAtriptan (IMITREX) 100 MG tablet Take 1 tablet (100 mg total) by mouth daily as needed for migraine. May repeat in 2  hours if headache persists or recurs. 05/13/18  Yes Tonia Ghent, MD    Allergies as of 11/25/2018 - Review Complete 05/13/2018  Allergen Reaction Noted  . Topamax [topiramate] Other (See Comments) 01/04/2016    Family History  Problem Relation Age of Onset  . Cancer Mother 78       unknow primary cancer  . Skin cancer Father   . Prostate cancer Father 52  . Heart disease Father   . Cancer Maternal Uncle 1       eye cancer  . Colon cancer Maternal Grandmother   . Breast cancer Maternal Grandmother        over the age of 1  . Pancreatic cancer Paternal Grandmother 60  . Alcohol abuse Daughter   . Breast cancer Half-Sister 42  . Alcohol abuse Half-Brother     Social History   Socioeconomic History  . Marital status: Married    Spouse name: Not on file  . Number of children: Not on file  . Years of education: Not on file  . Highest education level: Not on file  Occupational History  . Not on file  Social Needs  . Financial resource strain: Not on file  . Food insecurity    Worry: Not on file    Inability: Not on file  . Transportation needs    Medical: Not on file    Non-medical: Not on file  Tobacco Use  . Smoking status: Never Smoker  .  Smokeless tobacco: Never Used  Substance and Sexual Activity  . Alcohol use: Yes    Comment: rare  . Drug use: No  . Sexual activity: Not on file  Lifestyle  . Physical activity    Days per week: Not on file    Minutes per session: Not on file  . Stress: Not on file  Relationships  . Social Herbalist on phone: Not on file    Gets together: Not on file    Attends religious service: Not on file    Active member of club or organization: Not on file    Attends meetings of clubs or organizations: Not on file    Relationship status: Not on file  . Intimate partner violence    Fear of current or ex partner: Not on file    Emotionally abused: Not on file    Physically abused: Not on file    Forced sexual  activity: Not on file  Other Topics Concern  . 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 Neck:  Supple; no masses or thyromegaly. Lungs:  Clear throughout to auscultation.    Heart:  Regular rate and rhythm. Abdomen:  Soft, nontender and nondistended. Normal bowel sounds, without guarding, and without rebound.   Neurologic:  Alert and  oriented x4;  grossly normal neurologically.  Impression/Plan: Gina Fleming is here for an colonoscopy to be performed for colon cancer screening  Risks, benefits, limitations, and alternatives regarding  colonoscopy have been reviewed with the patient.  Questions have been answered.  All parties agreeable.   Sherri Sear, MD  12/24/2018, 8:25 AM

## 2018-12-24 NOTE — Transfer of Care (Signed)
Immediate Anesthesia Transfer of Care Note  Patient: Gina Fleming  Procedure(s) Performed: COLONOSCOPY WITH PROPOFOL (N/A )  Patient Location: Endoscopy Unit  Anesthesia Type:General  Level of Consciousness: drowsy and patient cooperative  Airway & Oxygen Therapy: Patient Spontanous Breathing and Patient connected to face mask oxygen  Post-op Assessment: Report given to RN and Post -op Vital signs reviewed and stable  Post vital signs: Reviewed and stable  Last Vitals:  Vitals Value Taken Time  BP    Temp 36.3 C 12/24/18 0943  Pulse 78 12/24/18 0943  Resp 13 12/24/18 0943  SpO2 99 % 12/24/18 0943  Vitals shown include unvalidated device data.  Last Pain:  Vitals:   12/24/18 0943  TempSrc: Tympanic  PainSc:          Complications: No apparent anesthesia complications

## 2018-12-25 ENCOUNTER — Encounter: Payer: Self-pay | Admitting: Gastroenterology

## 2018-12-31 NOTE — Anesthesia Postprocedure Evaluation (Signed)
Anesthesia Post Note  Patient: Gina Fleming  Procedure(s) Performed: COLONOSCOPY WITH PROPOFOL (N/A )  Patient location during evaluation: PACU Anesthesia Type: General Level of consciousness: awake and alert Pain management: pain level controlled Vital Signs Assessment: post-procedure vital signs reviewed and stable Respiratory status: spontaneous breathing, nonlabored ventilation, respiratory function stable and patient connected to nasal cannula oxygen Cardiovascular status: blood pressure returned to baseline and stable Postop Assessment: no apparent nausea or vomiting Anesthetic complications: no     Last Vitals:  Vitals:   12/24/18 1002 12/24/18 1004  BP:  94/60  Pulse:  79  Resp: 16 10  Temp:    SpO2:  100%    Last Pain:  Vitals:   12/25/18 0854  TempSrc:   PainSc: 0-No pain                 Molli Barrows

## 2019-01-24 DIAGNOSIS — H524 Presbyopia: Secondary | ICD-10-CM | POA: Diagnosis not present

## 2019-01-24 DIAGNOSIS — H5213 Myopia, bilateral: Secondary | ICD-10-CM | POA: Diagnosis not present

## 2019-03-17 MED FILL — LARISSIA 0.1-20 MG-MCG TABS: 0.1-20 | 84 days supply | Qty: 112 | Fill #1

## 2019-03-17 MED FILL — SUMAtriptan SUCCINATE 100 M: 100 | 90 days supply | Qty: 24 | Fill #1

## 2019-05-11 ENCOUNTER — Other Ambulatory Visit: Payer: Self-pay | Admitting: Family Medicine

## 2019-05-11 DIAGNOSIS — G43009 Migraine without aura, not intractable, without status migrainosus: Secondary | ICD-10-CM

## 2019-05-11 DIAGNOSIS — Z13220 Encounter for screening for lipoid disorders: Secondary | ICD-10-CM

## 2019-05-15 ENCOUNTER — Other Ambulatory Visit (INDEPENDENT_AMBULATORY_CARE_PROVIDER_SITE_OTHER): Payer: 59

## 2019-05-15 ENCOUNTER — Other Ambulatory Visit: Payer: Self-pay

## 2019-05-15 DIAGNOSIS — G43009 Migraine without aura, not intractable, without status migrainosus: Secondary | ICD-10-CM

## 2019-05-15 DIAGNOSIS — Z13220 Encounter for screening for lipoid disorders: Secondary | ICD-10-CM

## 2019-05-15 LAB — LIPID PANEL
Cholesterol: 170 mg/dL (ref 0–200)
HDL: 50.5 mg/dL (ref >39.00)
LDL Cholesterol: 107 mg/dL — ABNORMAL HIGH (ref 0–99)
NonHDL: 119.31
Total CHOL/HDL Ratio: 3
Triglycerides: 60 mg/dL (ref 0.0–149.0)
VLDL: 12 mg/dL (ref 0.0–40.0)

## 2019-05-15 LAB — BASIC METABOLIC PANEL
BUN: 11 mg/dL (ref 6–23)
CO2: 28 mEq/L (ref 19–32)
Calcium: 9 mg/dL (ref 8.4–10.5)
Chloride: 106 mEq/L (ref 96–112)
Creatinine, Ser: 0.71 mg/dL (ref 0.40–1.20)
GFR: 86.9 mL/min (ref >60.00)
Glucose, Bld: 99 mg/dL (ref 70–99)
Potassium: 4.2 mEq/L (ref 3.5–5.1)
Sodium: 139 mEq/L (ref 135–145)

## 2019-05-19 ENCOUNTER — Encounter: Payer: Self-pay | Admitting: Family Medicine

## 2019-05-19 ENCOUNTER — Ambulatory Visit (INDEPENDENT_AMBULATORY_CARE_PROVIDER_SITE_OTHER): Payer: 59 | Admitting: Family Medicine

## 2019-05-19 ENCOUNTER — Other Ambulatory Visit: Payer: Self-pay

## 2019-05-19 VITALS — BP 112/78 | HR 82 | Temp 97.2°F | Ht 65.0 in | Wt 143.4 lb

## 2019-05-19 DIAGNOSIS — Z7189 Other specified counseling: Secondary | ICD-10-CM

## 2019-05-19 DIAGNOSIS — Z0001 Encounter for general adult medical examination with abnormal findings: Secondary | ICD-10-CM | POA: Diagnosis not present

## 2019-05-19 DIAGNOSIS — G43009 Migraine without aura, not intractable, without status migrainosus: Secondary | ICD-10-CM

## 2019-05-19 MED ORDER — SUMATRIPTAN SUCCINATE 100 MG PO TABS: 100.0000 mg | ORAL_TABLET | Freq: Every day | ORAL | 3 refills | Status: DC | PRN

## 2019-05-19 MED ORDER — ONDANSETRON HCL 4 MG PO TABS: 4.0000 mg | ORAL_TABLET | Freq: Three times a day (TID) | ORAL | 3 refills | Status: DC | PRN

## 2019-05-19 MED FILL — ONDANSETRON HCL 4 MG TABLET: 4 | 10 days supply | Qty: 30 | Fill #0

## 2019-05-19 NOTE — Patient Instructions (Signed)
Thanks for your effort.  Thanks for getting a flu shot.  Update me as needed.  Take care.  Glad to see you.  Reasonable to try tapering your birth control (with back up) every 12 to 18 months.

## 2019-05-19 NOTE — Progress Notes (Signed)
This visit occurred during the SARS-CoV-2 public health emergency.  Safety protocols were in place, including screening questions prior to the visit, additional usage of staff PPE, and extensive cleaning of exam room while observing appropriate contact time as indicated for disinfecting solutions.   CPE- See plan.  Routine anticipatory guidance given to patient.  See health maintenance.  The possibility exists that previously documented standard health maintenance information may have been brought forward from a previous encounter into this note.  If needed, that same information has been updated to reflect the current situation based on today's encounter.    Tetanus2014 Flu shotdone at work. pna and shingles not due, dw pt Pap per gyn, 05/2015 normal per patient report Mammogram2020 DXA not due Colonoscopy 2020 Living will d/w pt. Husband designated if patient were incapacitated, then her brother [name] if husband incapacitated.  Diet and exercise d/w pt. HIV screen done at work ~1999.  Labs d/w pt.   Migraine w/o aura.  A few month, with work stressors noted.  imitrex helps.  No ADE on med.  Flexeril didn't help.    Birth control pills d/w pt.  She has h/o migraine w/o aura.  BP is controlled. We talked about trial of cessation of OCPs episodically, ie yearly.  She agrees and she'll update me as needed.  No ADE on med.    PMH and SH reviewed  Meds, vitals, and allergies reviewed.   ROS: Per HPI.  Unless specifically indicated otherwise in HPI, the patient denies:  General: fever. Eyes: acute vision changes ENT: sore throat Cardiovascular: chest pain Respiratory: SOB GI: vomiting GU: dysuria Musculoskeletal: acute back pain Derm: acute rash Neuro: acute motor dysfunction Psych: worsening mood Endocrine: polydipsia Heme: bleeding Allergy: hayfever  GEN: nad, alert and oriented HEENT: ncat NECK: supple w/o LA CV: rrr. PULM: ctab, no inc wob ABD: soft, +bs EXT:  no edema SKIN: no acute rash  The 10-year ASCVD risk score Mikey Bussing DC Jr., et al., 2013) is: 0.9%   Values used to calculate the score:     Age: 26 years     Sex: Female     Is Non-Hispanic African American: No     Diabetic: No     Tobacco smoker: No     Systolic Blood Pressure: [id number] mmHg     Is BP treated: No     HDL Cholesterol: 50.5 mg/dL     Total Cholesterol: 170 mg/dL

## 2019-05-21 NOTE — Assessment & Plan Note (Addendum)
Tetanus2014 Flu shotdone at work. pna and shingles not due, dw pt Pap per gyn, 05/2015 normal per patient report Mammogram2020 DXA not due Colonoscopy 2020 Living will d/w pt. Husband designated if patient were incapacitated, then her brother [name] if husband incapacitated.  Diet and exercise d/w pt. HIV screen done at work ~1999.  Labs d/w pt.   Birth control pills d/w pt.  She has h/o migraine w/o aura.  BP is controlled. We talked about trial of cessation of OCPs episodically, ie yearly.  She agrees and she'll update me as needed.  No ADE on med.

## 2019-05-21 NOTE — Assessment & Plan Note (Signed)
A few month, with work stressors noted.  imitrex helps.  No ADE on med.  Flexeril didn't help.  Continue as needed Imitrex.

## 2019-05-21 NOTE — Assessment & Plan Note (Signed)
Living will d/w pt. Husband designated if patient were incapacitated, then her brother [name] if husband incapacitated.

## 2019-06-26 MED FILL — LARISSIA 0.1-20 MG-MCG TABS: 0.1-20 | 84 days supply | Qty: 112 | Fill #2

## 2019-06-26 MED FILL — SUMAtriptan SUCCINATE 100 M: 100 | 80 days supply | Qty: 24 | Fill #0

## 2019-09-05 ENCOUNTER — Other Ambulatory Visit (HOSPITAL_COMMUNITY): Payer: Self-pay | Admitting: Obstetrics

## 2019-09-05 DIAGNOSIS — Z1331 Encounter for screening for depression: Secondary | ICD-10-CM | POA: Diagnosis not present

## 2019-09-05 DIAGNOSIS — Z01419 Encounter for gynecological examination (general) (routine) without abnormal findings: Secondary | ICD-10-CM | POA: Diagnosis not present

## 2019-12-09 MED FILL — SUMAtriptan SUCCINATE 100 M: 100 | 80 days supply | Qty: 24 | Fill #2

## 2020-01-29 DIAGNOSIS — H5213 Myopia, bilateral: Secondary | ICD-10-CM | POA: Diagnosis not present

## 2020-02-17 MED FILL — SUMAtriptan SUCCINATE 100 M: 100 | 80 days supply | Qty: 24 | Fill #3

## 2020-02-17 MED FILL — LARISSIA 0.1-20 MG-MCG TABS: 0.1-20 | 84 days supply | Qty: 112 | Fill #1

## 2020-05-05 MED FILL — LARISSIA 0.1-20 MG-MCG TABS: 0.1-20 | 84 days supply | Qty: 112 | Fill #2

## 2020-05-17 ENCOUNTER — Other Ambulatory Visit: Payer: Self-pay | Admitting: Family Medicine

## 2020-05-17 DIAGNOSIS — E785 Hyperlipidemia, unspecified: Secondary | ICD-10-CM

## 2020-05-17 DIAGNOSIS — G43009 Migraine without aura, not intractable, without status migrainosus: Secondary | ICD-10-CM

## 2020-05-20 ENCOUNTER — Other Ambulatory Visit: Payer: Self-pay | Admitting: Family Medicine

## 2020-05-20 MED ORDER — SUMATRIPTAN SUCCINATE 100 MG PO TABS: 100.0000 mg | ORAL_TABLET | Freq: Every day | ORAL | 3 refills | Status: DC | PRN

## 2020-05-20 MED FILL — SUMAtriptan SUCCINATE 100 M: 100 | 83 days supply | Qty: 24 | Fill #0

## 2020-05-20 NOTE — Telephone Encounter (Signed)
Pharmacy requests refill on: Sumatriptan 100 mg   LAST REFILL: 05/19/2019 (Q-24, R-3) LAST OV: 05/19/2019 NEXT OV: 06/11/2020 PHARMACY: [facility]   Patient called and stated that she only has one pill left.

## 2020-05-20 NOTE — Addendum Note (Signed)
Addended by: Joaquim Nam on: 05/20/2020 01:40 PM   Modules accepted: Orders

## 2020-05-20 NOTE — Telephone Encounter (Signed)
Sent. Thanks.   

## 2020-05-31 ENCOUNTER — Other Ambulatory Visit (INDEPENDENT_AMBULATORY_CARE_PROVIDER_SITE_OTHER): Payer: 59

## 2020-05-31 ENCOUNTER — Other Ambulatory Visit: Payer: Self-pay

## 2020-05-31 DIAGNOSIS — G43009 Migraine without aura, not intractable, without status migrainosus: Secondary | ICD-10-CM

## 2020-05-31 DIAGNOSIS — E785 Hyperlipidemia, unspecified: Secondary | ICD-10-CM | POA: Diagnosis not present

## 2020-05-31 LAB — LIPID PANEL
Cholesterol: 180 mg/dL (ref 0–200)
HDL: 47.5 mg/dL (ref >39.00)
LDL Cholesterol: 115 mg/dL — ABNORMAL HIGH (ref 0–99)
NonHDL: 132.31
Total CHOL/HDL Ratio: 4
Triglycerides: 89 mg/dL (ref 0.0–149.0)
VLDL: 17.8 mg/dL (ref 0.0–40.0)

## 2020-05-31 LAB — BASIC METABOLIC PANEL
BUN: 10 mg/dL (ref 6–23)
CO2: 28 mEq/L (ref 19–32)
Calcium: 9.1 mg/dL (ref 8.4–10.5)
Chloride: 105 mEq/L (ref 96–112)
Creatinine, Ser: 0.74 mg/dL (ref 0.40–1.20)
GFR: 93.48 mL/min (ref >60.00)
Glucose, Bld: 92 mg/dL (ref 70–99)
Potassium: 4.3 mEq/L (ref 3.5–5.1)
Sodium: 139 mEq/L (ref 135–145)

## 2020-06-11 ENCOUNTER — Encounter: Payer: 59 | Admitting: Family Medicine

## 2020-06-18 ENCOUNTER — Ambulatory Visit (INDEPENDENT_AMBULATORY_CARE_PROVIDER_SITE_OTHER): Payer: 59 | Admitting: Family Medicine

## 2020-06-18 ENCOUNTER — Encounter: Payer: Self-pay | Admitting: Family Medicine

## 2020-06-18 ENCOUNTER — Other Ambulatory Visit: Payer: Self-pay

## 2020-06-18 VITALS — BP 110/84 | HR 97 | Temp 97.5°F | Ht 66.0 in | Wt 151.0 lb

## 2020-06-18 DIAGNOSIS — Z0001 Encounter for general adult medical examination with abnormal findings: Secondary | ICD-10-CM

## 2020-06-18 DIAGNOSIS — G43009 Migraine without aura, not intractable, without status migrainosus: Secondary | ICD-10-CM

## 2020-06-18 DIAGNOSIS — Z7189 Other specified counseling: Secondary | ICD-10-CM

## 2020-06-18 MED ORDER — ONDANSETRON HCL 4 MG PO TABS: 4.0000 mg | ORAL_TABLET | Freq: Three times a day (TID) | ORAL | 3 refills | Status: DC | PRN

## 2020-06-18 NOTE — Progress Notes (Addendum)
This visit occurred during the SARS-CoV-2 public health emergency.  Safety protocols were in place, including screening questions prior to the visit, additional usage of staff PPE, and extensive cleaning of exam room while observing appropriate contact time as indicated for disinfecting solutions.  CPE- See plan.  Routine anticipatory guidance given to patient.  See health maintenance.  The possibility exists that previously documented standard health maintenance information may have been brought forward from a previous encounter into this note.  If needed, that same information has been updated to reflect the current situation based on today's encounter.    Tetanus2014 Flu shotdone at work. pna not due shingles d/w pt.   Pap per gyn, 05/2015 normal per patient report, d/w pt about gyn f/u Mammogram2020, d/w pt about f/u.   DXA not due Colonoscopy 2020 Living will d/w pt. Husband designated if patient were incapacitated, then her brother [name] if husband incapacitated.  Diet and exercise d/w pt. HIV screen done at work ~1999.  Labs d/w pt.   She has noted occasional bilateral second finger pain without injury or trauma or clear trigger noted.  I asked her to observe this in the meantime and update me as needed.  FH cancer noted.  She had prev genetic clinic eval.  D/w pt.    Birth control pills d/w pt.  She has h/o migraine w/o aura.  BP is controlled. We talked about trial of cessation of OCPs episodically, ie yearly.  She agrees and she'll update me as needed- she has had discussion with gyn clinic.  No ADE on med.    Imitrex helped enough to be useful.  No ADE on med except for some mild and tolerable abd discomfort.  No blood in stool, no vomiting.    She is working more hours, at the bench and managing the labs.    PMH and SH reviewed  Meds, vitals, and allergies reviewed.   ROS: Per HPI.  Unless specifically indicated otherwise in HPI, the patient denies:  General:  fever. Eyes: acute vision changes ENT: sore throat Cardiovascular: chest pain Respiratory: SOB GI: vomiting GU: dysuria Musculoskeletal: acute back pain Derm: acute rash Neuro: acute motor dysfunction Psych: worsening mood Endocrine: polydipsia Heme: bleeding Allergy: hayfever  GEN: nad, alert and oriented HEENT: ncat NECK: supple w/o LA CV: rrr. PULM: ctab, no inc wob ABD: soft, +bs EXT: no edema SKIN: no acute rash Bilateral hand exam symmetric without obvious synovitis or erythema.  No bruising and range of motion normal at the IP joints and MCP joints.

## 2020-06-18 NOTE — Patient Instructions (Addendum)
You can call for a mammogram at th [facility]  Update me as needed. Thanks for your effort.  Take care.  Glad to see you.

## 2020-06-19 ENCOUNTER — Encounter: Payer: Self-pay | Admitting: Family Medicine

## 2020-06-20 NOTE — Assessment & Plan Note (Signed)
Living will d/w pt. Husband designated if patient were incapacitated, then her brother [name] if husband incapacitated.

## 2020-06-20 NOTE — Assessment & Plan Note (Signed)
Imitrex helped enough to be useful.  No ADE on med except for some mild and tolerable abd discomfort.  No blood in stool, no vomiting.   Would continue as needed Imitrex as is.  She is in discussion with gynecology about birth control taper as she ages.  I will defer.  She agrees.

## 2020-06-20 NOTE — Assessment & Plan Note (Signed)
Tetanus2014 Flu shotdone at work. pna not due shingles d/w pt.   Pap per gyn, 05/2015 normal per patient report, d/w pt about gyn f/u Mammogram2020, d/w pt about f/u.   DXA not due Colonoscopy 2020 Living will d/w pt. Husband designated if patient were incapacitated, then her brother [name] if husband incapacitated.  Diet and exercise d/w pt. HIV screen done at work ~1999.

## 2020-06-25 ENCOUNTER — Other Ambulatory Visit: Payer: Self-pay | Admitting: Family Medicine

## 2020-06-25 DIAGNOSIS — Z1231 Encounter for screening mammogram for malignant neoplasm of breast: Secondary | ICD-10-CM

## 2020-07-15 ENCOUNTER — Ambulatory Visit
Admission: RE | Admit: 2020-07-15 | Discharge: 2020-07-15 | Disposition: A | Discharge status: Home / Self Care | Payer: 59 | Source: Ambulatory Visit | Attending: Family Medicine | Admitting: Family Medicine

## 2020-07-15 ENCOUNTER — Other Ambulatory Visit: Payer: Self-pay

## 2020-07-15 DIAGNOSIS — Z1231 Encounter for screening mammogram for malignant neoplasm of breast: Secondary | ICD-10-CM | POA: Diagnosis not present

## 2020-07-27 MED FILL — LARISSIA 0.1-20 MG-MCG TABS: 0.1-20 | 84 days supply | Qty: 112 | Fill #3

## 2020-07-30 MED FILL — SUMAtriptan SUCCINATE 100 M: 100 | 83 days supply | Qty: 24 | Fill #1

## 2020-08-03 ENCOUNTER — Encounter: Payer: Self-pay | Admitting: Family Medicine

## 2020-08-03 ENCOUNTER — Ambulatory Visit: Payer: 59 | Admitting: Family Medicine

## 2020-08-03 ENCOUNTER — Other Ambulatory Visit: Payer: Self-pay

## 2020-08-03 ENCOUNTER — Other Ambulatory Visit: Payer: Self-pay | Admitting: Family Medicine

## 2020-08-03 VITALS — BP 112/80 | HR 88 | Temp 98.2°F | Ht 66.0 in | Wt 153.0 lb

## 2020-08-03 DIAGNOSIS — B351 Tinea unguium: Secondary | ICD-10-CM | POA: Diagnosis not present

## 2020-08-03 DIAGNOSIS — G43009 Migraine without aura, not intractable, without status migrainosus: Secondary | ICD-10-CM | POA: Diagnosis not present

## 2020-08-03 MED ORDER — AMITRIPTYLINE HCL 10 MG PO TABS: 10.0000 mg | ORAL_TABLET | Freq: Every day | ORAL | 1 refills | Status: DC

## 2020-08-03 NOTE — Patient Instructions (Signed)
Go to the lab on the way out.   If you have mychart we'll likely use that to update you.    I'll work on the lamisil rx after I see your labs.  Plan on recheck labs in 6 weeks.  Would try amitriptyline 10-20mg  at night.  Take care.  Glad to see you.

## 2020-08-03 NOTE — Progress Notes (Signed)
This visit occurred during the SARS-CoV-2 public health emergency.  Safety protocols were in place, including screening questions prior to the visit, additional usage of staff PPE, and extensive cleaning of exam room while observing appropriate contact time as indicated for disinfecting solutions.  D/w pt about botox versus other prophylactic options for migraines.  She is still having migraines.  HA are similar to prev (ie not a new set of sx) but more frequent.  Prev on topamax and ergotamine.  Migraines worse with inc in stress at work.  No aura.  Photophobia, pain>throbbing.    Nail fungus noted on the bilateral first nails.  She was asking about options.  She has tried topical treatment without relief.  Meds, vitals, and allergies reviewed.   ROS: Per HPI unless specifically indicated in ROS section   GEN: nad, alert and oriented HEENT:ncat NECK: supple w/o LA CV: rrr.  PULM: ctab, no inc wob ABD: soft, +bs EXT: no edema SKIN: Well-perfused CN 2-12 wnl B, S/S wnl x4 Onychomycosis noted on the right first nail.  32 minutes were devoted to patient care in this encounter (this includes time spent reviewing the patient's file/history, interviewing and examining the patient, counseling/reviewing plan with patient).

## 2020-08-04 ENCOUNTER — Other Ambulatory Visit: Payer: Self-pay | Admitting: Family Medicine

## 2020-08-04 DIAGNOSIS — B351 Tinea unguium: Secondary | ICD-10-CM | POA: Insufficient documentation

## 2020-08-04 LAB — HEPATIC FUNCTION PANEL
ALT: 17 U/L (ref 0–35)
AST: 16 U/L (ref 0–37)
Albumin: 4.2 g/dL (ref 3.5–5.2)
Alkaline Phosphatase: 60 U/L (ref 39–117)
Bilirubin, Direct: 0.1 mg/dL (ref 0.0–0.3)
Total Bilirubin: 0.5 mg/dL (ref 0.2–1.2)
Total Protein: 7.4 g/dL (ref 6.0–8.3)

## 2020-08-04 MED ORDER — TERBINAFINE HCL 250 MG PO TABS: 250.0000 mg | ORAL_TABLET | Freq: Every day | ORAL | 1 refills | Status: DC

## 2020-08-04 NOTE — Assessment & Plan Note (Signed)
Routine cautions on systemic antifungals discussed with patient.  Check LFTs today.  If normal, she consented to start treatment.  Would plan for 6 weeks of treatment and then recheck LFTs, if LFTs are still normal, then we will plan on another 6 weeks of treatment.  See notes on labs.

## 2020-08-04 NOTE — Assessment & Plan Note (Signed)
Reasonable to try amitriptyline 10 mg at night and then increase to 20 mg if needed/tolerated.  Refer to neurology.  She may be a good candidate for Botox for prophylactic treatment but there may be other prophylactic medications that neurology may offer.  Discussed.  She agrees with plan.  She will update me as needed.

## 2020-08-06 ENCOUNTER — Encounter: Payer: Self-pay | Admitting: Neurology

## 2020-09-14 ENCOUNTER — Other Ambulatory Visit (INDEPENDENT_AMBULATORY_CARE_PROVIDER_SITE_OTHER): Payer: 59

## 2020-09-14 ENCOUNTER — Other Ambulatory Visit: Payer: Self-pay

## 2020-09-14 DIAGNOSIS — B351 Tinea unguium: Secondary | ICD-10-CM

## 2020-09-15 LAB — HEPATIC FUNCTION PANEL
ALT: 36 U/L — ABNORMAL HIGH (ref 0–35)
AST: 18 U/L (ref 0–37)
Albumin: 4.3 g/dL (ref 3.5–5.2)
Alkaline Phosphatase: 64 U/L (ref 39–117)
Bilirubin, Direct: 0.1 mg/dL (ref 0.0–0.3)
Total Bilirubin: 0.5 mg/dL (ref 0.2–1.2)
Total Protein: 7.1 g/dL (ref 6.0–8.3)

## 2020-09-16 ENCOUNTER — Other Ambulatory Visit (HOSPITAL_COMMUNITY): Payer: Self-pay

## 2020-09-16 DIAGNOSIS — Z1331 Encounter for screening for depression: Secondary | ICD-10-CM | POA: Diagnosis not present

## 2020-09-16 DIAGNOSIS — Z01419 Encounter for gynecological examination (general) (routine) without abnormal findings: Secondary | ICD-10-CM | POA: Diagnosis not present

## 2020-09-16 MED ORDER — LEVONORGESTREL-ETHINYL ESTRAD 0.1-20 MG-MCG PO TABS
1.0000 | ORAL_TABLET | Freq: Every day | ORAL | 3 refills | Status: DC
  Filled 2020-09-16 – 2021-02-09 (×2): qty 112, 84d supply, fill #0
  Filled 2021-05-04: qty 112, 84d supply, fill #1

## 2020-09-16 MED FILL — Terbinafine HCl Tab 250 MG: ORAL | 42 days supply | Qty: 42 | Fill #0 | Status: AC

## 2020-09-16 MED FILL — Amitriptyline HCl Tab 10 MG: ORAL | 30 days supply | Qty: 60 | Fill #0 | Status: AC

## 2020-09-17 ENCOUNTER — Other Ambulatory Visit (HOSPITAL_COMMUNITY): Payer: Self-pay

## 2020-10-12 ENCOUNTER — Ambulatory Visit: Payer: 59 | Admitting: Neurology

## 2020-10-22 ENCOUNTER — Other Ambulatory Visit (HOSPITAL_COMMUNITY): Payer: Self-pay

## 2020-10-22 MED FILL — Sumatriptan Succinate Tab 100 MG: ORAL | 80 days supply | Qty: 24 | Fill #0 | Status: AC

## 2020-10-29 ENCOUNTER — Telehealth: Payer: Self-pay

## 2020-10-29 NOTE — Telephone Encounter (Signed)
Pt will complete 2nd course of medication tonight. She has not seen a difference. Asking if she needs to take more.  If not to continue with medication, what is the next step? Please advise at [phone]. Send to [place] on [facility].  I have sent a teams message to Dr Damita Dunnings asking him to see message before the end of the day.

## 2020-10-29 NOTE — Telephone Encounter (Signed)
Spoke to husband. He will let her know.

## 2020-10-29 NOTE — Telephone Encounter (Signed)
If no improvement and not having new normal nail growing out, then I wouldn't extend the course.  Unless she is having sig symptoms in the meantime (mechanical symptoms from thickened nail), then I would go 1-2 months off med and see what the nail does.  Thanks.

## 2020-11-22 ENCOUNTER — Other Ambulatory Visit: Payer: Self-pay | Admitting: Family Medicine

## 2020-11-24 ENCOUNTER — Other Ambulatory Visit: Payer: Self-pay | Admitting: Family Medicine

## 2020-11-24 ENCOUNTER — Other Ambulatory Visit (HOSPITAL_COMMUNITY): Payer: Self-pay

## 2020-11-24 MED ORDER — AMITRIPTYLINE HCL 10 MG PO TABS
10.0000 mg | ORAL_TABLET | Freq: Every day | ORAL | 5 refills | Status: DC
  Filled 2020-11-24: qty 60, 30d supply, fill #0
  Filled 2021-01-27: qty 60, 30d supply, fill #1
  Filled 2021-03-22: qty 60, 30d supply, fill #2
  Filled 2021-06-01: qty 60, 30d supply, fill #3
  Filled 2021-08-02: qty 60, 30d supply, fill #4

## 2020-11-24 MED ORDER — AMITRIPTYLINE HCL 10 MG PO TABS
ORAL_TABLET | ORAL | 1 refills | Status: DC
  Filled 2020-11-24: qty 60, fill #0

## 2020-12-10 ENCOUNTER — Telehealth: Payer: Self-pay | Admitting: Family Medicine

## 2020-12-10 DIAGNOSIS — N951 Menopausal and female climacteric states: Secondary | ICD-10-CM

## 2020-12-10 NOTE — Telephone Encounter (Signed)
Gina Fleming called in wanted to know about getting labs drawn for [name] to see if she is pre-menopausal. And she is shooting for 8/4 in the afternoon

## 2020-12-12 NOTE — Telephone Encounter (Signed)
I put in the order.  Please help her get a lab appointment scheduled.  Thanks.

## 2020-12-12 NOTE — Addendum Note (Signed)
Addended by: Tonia Ghent on: 12/12/2020 04:50 PM   Modules accepted: Orders

## 2020-12-13 NOTE — Telephone Encounter (Signed)
LMTCB to schedule lab appt.

## 2020-12-13 NOTE — Telephone Encounter (Signed)
Patient called back and scheduled lab appt for 12/23/20 at 1:00 pm.

## 2020-12-23 ENCOUNTER — Other Ambulatory Visit: Payer: Self-pay

## 2020-12-23 ENCOUNTER — Other Ambulatory Visit (INDEPENDENT_AMBULATORY_CARE_PROVIDER_SITE_OTHER): Payer: 59

## 2020-12-23 DIAGNOSIS — N951 Menopausal and female climacteric states: Secondary | ICD-10-CM | POA: Diagnosis not present

## 2020-12-23 LAB — FOLLICLE STIMULATING HORMONE: FSH: 6.4 m[IU]/mL

## 2021-01-27 ENCOUNTER — Other Ambulatory Visit (HOSPITAL_COMMUNITY): Payer: Self-pay

## 2021-01-27 ENCOUNTER — Other Ambulatory Visit: Payer: Self-pay

## 2021-01-28 ENCOUNTER — Other Ambulatory Visit (HOSPITAL_COMMUNITY): Payer: Self-pay

## 2021-01-31 ENCOUNTER — Other Ambulatory Visit (HOSPITAL_COMMUNITY): Payer: Self-pay

## 2021-02-01 ENCOUNTER — Other Ambulatory Visit (HOSPITAL_COMMUNITY): Payer: Self-pay

## 2021-02-02 ENCOUNTER — Other Ambulatory Visit (HOSPITAL_COMMUNITY): Payer: Self-pay

## 2021-02-03 ENCOUNTER — Other Ambulatory Visit (HOSPITAL_COMMUNITY): Payer: Self-pay

## 2021-02-07 DIAGNOSIS — H5213 Myopia, bilateral: Secondary | ICD-10-CM | POA: Diagnosis not present

## 2021-02-07 DIAGNOSIS — H524 Presbyopia: Secondary | ICD-10-CM | POA: Diagnosis not present

## 2021-02-09 ENCOUNTER — Other Ambulatory Visit (HOSPITAL_COMMUNITY): Payer: Self-pay

## 2021-03-22 ENCOUNTER — Other Ambulatory Visit (HOSPITAL_COMMUNITY): Payer: Self-pay

## 2021-03-22 MED FILL — Sumatriptan Succinate Tab 100 MG: ORAL | 80 days supply | Qty: 24 | Fill #1 | Status: AC

## 2021-05-05 ENCOUNTER — Other Ambulatory Visit (HOSPITAL_COMMUNITY): Payer: Self-pay

## 2021-06-02 ENCOUNTER — Other Ambulatory Visit (HOSPITAL_COMMUNITY): Payer: Self-pay

## 2021-06-02 ENCOUNTER — Other Ambulatory Visit: Payer: Self-pay | Admitting: Family Medicine

## 2021-06-03 ENCOUNTER — Other Ambulatory Visit (HOSPITAL_COMMUNITY): Payer: Self-pay

## 2021-06-03 MED ORDER — SUMATRIPTAN SUCCINATE 100 MG PO TABS
100.0000 mg | ORAL_TABLET | ORAL | 1 refills | Status: DC
  Filled 2021-06-03: qty 24, 80d supply, fill #0
  Filled 2021-09-04: qty 24, 80d supply, fill #1

## 2021-06-03 NOTE — Telephone Encounter (Signed)
Patient will be due for appt in march; please call to schedule.

## 2021-06-20 ENCOUNTER — Other Ambulatory Visit: Payer: Self-pay | Admitting: Obstetrics

## 2021-06-20 ENCOUNTER — Other Ambulatory Visit: Payer: Self-pay | Admitting: Family Medicine

## 2021-06-20 DIAGNOSIS — Z1231 Encounter for screening mammogram for malignant neoplasm of breast: Secondary | ICD-10-CM

## 2021-07-19 ENCOUNTER — Ambulatory Visit
Admission: RE | Admit: 2021-07-19 | Discharge: 2021-07-19 | Disposition: A | Discharge status: Home / Self Care | Payer: 59 | Source: Ambulatory Visit | Attending: Obstetrics | Admitting: Obstetrics

## 2021-07-19 ENCOUNTER — Other Ambulatory Visit: Payer: Self-pay

## 2021-07-19 DIAGNOSIS — Z1231 Encounter for screening mammogram for malignant neoplasm of breast: Secondary | ICD-10-CM | POA: Diagnosis not present

## 2021-08-03 ENCOUNTER — Other Ambulatory Visit (HOSPITAL_COMMUNITY): Payer: Self-pay

## 2021-09-05 ENCOUNTER — Other Ambulatory Visit (HOSPITAL_COMMUNITY): Payer: Self-pay

## 2021-09-14 ENCOUNTER — Telehealth: Payer: Self-pay | Admitting: Family Medicine

## 2021-09-14 DIAGNOSIS — N951 Menopausal and female climacteric states: Secondary | ICD-10-CM

## 2021-09-14 DIAGNOSIS — E785 Hyperlipidemia, unspecified: Secondary | ICD-10-CM

## 2021-09-14 NOTE — Telephone Encounter (Signed)
Pt called to schedule a physical on 10/13/2021. She wanted to ask if she could get an [facility] drawn on the same day as well, to check to see if everything is okay. Stated it is not an emergent thing but just wanted to check.  ? ?Callback Number: [phone] ?

## 2021-09-14 NOTE — Telephone Encounter (Signed)
Please let her know I put in the orders [facility] included).  Thanks. ?

## 2021-09-15 NOTE — Telephone Encounter (Signed)
Patient aware orders were added to her labs. ?

## 2021-10-13 ENCOUNTER — Encounter: Payer: Self-pay | Admitting: Family Medicine

## 2021-10-13 ENCOUNTER — Other Ambulatory Visit (HOSPITAL_COMMUNITY): Payer: Self-pay

## 2021-10-13 ENCOUNTER — Ambulatory Visit (INDEPENDENT_AMBULATORY_CARE_PROVIDER_SITE_OTHER): Payer: 59 | Admitting: Family Medicine

## 2021-10-13 VITALS — BP 118/72 | HR 82 | Temp 97.9°F | Ht 66.0 in | Wt 154.0 lb

## 2021-10-13 DIAGNOSIS — Z0001 Encounter for general adult medical examination with abnormal findings: Secondary | ICD-10-CM

## 2021-10-13 DIAGNOSIS — Z7189 Other specified counseling: Secondary | ICD-10-CM

## 2021-10-13 DIAGNOSIS — E785 Hyperlipidemia, unspecified: Secondary | ICD-10-CM | POA: Diagnosis not present

## 2021-10-13 DIAGNOSIS — G43009 Migraine without aura, not intractable, without status migrainosus: Secondary | ICD-10-CM

## 2021-10-13 DIAGNOSIS — N951 Menopausal and female climacteric states: Secondary | ICD-10-CM | POA: Diagnosis not present

## 2021-10-13 LAB — COMPREHENSIVE METABOLIC PANEL
ALT: 13 U/L (ref 0–35)
AST: 12 U/L (ref 0–37)
Albumin: 4.4 g/dL (ref 3.5–5.2)
Alkaline Phosphatase: 81 U/L (ref 39–117)
BUN: 11 mg/dL (ref 6–23)
CO2: 29 mEq/L (ref 19–32)
Calcium: 9.2 mg/dL (ref 8.4–10.5)
Chloride: 105 mEq/L (ref 96–112)
Creatinine, Ser: 0.67 mg/dL (ref 0.40–1.20)
GFR: 100.02 mL/min (ref >60.00)
Glucose, Bld: 82 mg/dL (ref 70–99)
Potassium: 4.3 mEq/L (ref 3.5–5.1)
Sodium: 140 mEq/L (ref 135–145)
Total Bilirubin: 0.5 mg/dL (ref 0.2–1.2)
Total Protein: 6.7 g/dL (ref 6.0–8.3)

## 2021-10-13 LAB — LIPID PANEL
Cholesterol: 180 mg/dL (ref 0–200)
HDL: 50 mg/dL (ref >39.00)
LDL Cholesterol: 111 mg/dL — ABNORMAL HIGH (ref 0–99)
NonHDL: 129.83
Total CHOL/HDL Ratio: 4
Triglycerides: 92 mg/dL (ref 0.0–149.0)
VLDL: 18.4 mg/dL (ref 0.0–40.0)

## 2021-10-13 LAB — FOLLICLE STIMULATING HORMONE: FSH: 7.7 m[IU]/mL

## 2021-10-13 MED ORDER — ONDANSETRON HCL 4 MG PO TABS
4.0000 mg | ORAL_TABLET | Freq: Three times a day (TID) | ORAL | 3 refills | Status: DC | PRN
Start: 2021-10-13 — End: 2022-03-27
  Filled 2021-11-14: qty 30, 10d supply, fill #0
  Filled 2022-01-30: qty 30, 10d supply, fill #1

## 2021-10-13 MED ORDER — AMITRIPTYLINE HCL 10 MG PO TABS
10.0000 mg | ORAL_TABLET | Freq: Every day | ORAL | 3 refills | Status: DC
  Filled 2021-10-13 – 2021-10-28 (×2): qty 90, 90d supply, fill #0
  Filled 2022-01-30: qty 90, 90d supply, fill #1

## 2021-10-13 MED ORDER — SUMATRIPTAN SUCCINATE 100 MG PO TABS
100.0000 mg | ORAL_TABLET | Freq: Every day | ORAL | 1 refills | Status: DC | PRN
  Filled 2021-11-14: qty 24, 84d supply, fill #0
  Filled 2022-01-30: qty 24, 84d supply, fill #1

## 2021-10-13 NOTE — Progress Notes (Signed)
CPE- See plan.  Routine anticipatory guidance given to patient.  See health maintenance.  The possibility exists that previously documented standard health maintenance information may have been brought forward from a previous encounter into this note.  If needed, that same information has been updated to reflect the current situation based on today's encounter.    Tetanus 2014 Flu shot done at work.  pna not due shingles d/w pt.  Covid vaccine d/w pt.    Pap per gyn, 05/2015 normal per patient report, d/w pt about gyn f/u Mammogram 2023 DXA not due Colonoscopy 2020 Living will d/w pt.  Husband designated if patient were incapacitated, then her brother [name] if husband were incapacitated.   Diet and exercise d/w pt.   HIV screen done at work ~1999.    She is fasting but only for 4 hours, not 8.  D/w pt.  See notes on labs.    She is short staffed at work.  D/w pt.  She is working a lot.    Birth control use d/w pt.  She has h/o migraine w/o aura.  BP is controlled. We talked about trial of cessation of OCPs episodically, ie yearly.  [place] pending.  She agrees and she'll update me as needed- she has had discussion with gyn clinic.  No ADE on med.  gyn f/u is pending as of today.     Imitrex helped enough to be useful.  No ADE on med except for some mild and tolerable abd discomfort.  No blood in stool, no vomiting.  usually taking '10mg'$  amitriptyline at bedtime.  Has been off OCPs for 3 months.    PMH and SH reviewed  Meds, vitals, and allergies reviewed.   ROS: Per HPI.  Unless specifically indicated otherwise in HPI, the patient denies:  General: fever. Eyes: acute vision changes ENT: sore throat Cardiovascular: chest pain Respiratory: SOB GI: vomiting GU: dysuria Musculoskeletal: acute back pain Derm: acute rash Neuro: acute motor dysfunction Psych: worsening mood Endocrine: polydipsia Heme: bleeding Allergy: hayfever  GEN: nad, alert and oriented HEENT: ncat NECK: supple  w/o LA CV: rrr. PULM: ctab, no inc wob ABD: soft, +bs EXT: no edema SKIN: no acute rash

## 2021-10-13 NOTE — Patient Instructions (Signed)
Go to the lab on the way out.   If you have mychart we'll likely use that to update you.    Take care.  Glad to see you. Update me as needed.  I'll await the notes from gynecology.

## 2021-10-17 NOTE — Assessment & Plan Note (Signed)
Living will d/w pt.  Husband designated if patient were incapacitated, then her brother [name] if husband were incapacitated.

## 2021-10-17 NOTE — Assessment & Plan Note (Signed)
Birth control use d/w pt.  She has h/o migraine w/o aura.  BP is controlled. We talked about trial of cessation of OCPs episodically, ie yearly.  [place] pending.  She agrees and she'll update me as needed- she has had discussion with gyn clinic.  No ADE on med.  gyn f/u is pending as of today.     Imitrex helped enough to be useful.  No ADE on med except for some mild and tolerable abd discomfort.  No blood in stool, no vomiting.  usually taking '10mg'$  amitriptyline at bedtime.  Has been off OCPs for 3 months.    See notes on labs.

## 2021-10-17 NOTE — Assessment & Plan Note (Signed)
Tetanus 2014 Flu shot done at work.  pna not due shingles d/w pt.  Covid vaccine d/w pt.    Pap per gyn, 05/2015 normal per patient report, d/w pt about gyn f/u Mammogram 2023 DXA not due Colonoscopy 2020 Living will d/w pt.  Husband designated if patient were incapacitated, then her brother [name] if husband were incapacitated.    Diet and exercise d/w pt.   HIV screen done at work ~1999.    She is fasting but only for 4 hours, not 8.  D/w pt.  See notes on labs.

## 2021-10-21 ENCOUNTER — Other Ambulatory Visit (HOSPITAL_COMMUNITY): Payer: Self-pay

## 2021-10-28 ENCOUNTER — Other Ambulatory Visit (HOSPITAL_COMMUNITY): Payer: Self-pay

## 2021-11-14 ENCOUNTER — Other Ambulatory Visit (HOSPITAL_COMMUNITY): Payer: Self-pay

## 2021-11-14 MED ORDER — LEVONORGESTREL-ETHINYL ESTRAD 0.1-20 MG-MCG PO TABS
1.0000 | ORAL_TABLET | Freq: Every day | ORAL | 0 refills | Status: DC
  Filled 2021-11-14: qty 112, 84d supply, fill #0

## 2022-01-10 ENCOUNTER — Other Ambulatory Visit: Payer: Self-pay

## 2022-01-10 DIAGNOSIS — Z1331 Encounter for screening for depression: Secondary | ICD-10-CM | POA: Diagnosis not present

## 2022-01-10 DIAGNOSIS — Z130 Encounter for screening for diseases of the blood and blood-forming organs and certain disorders involving the immune mechanism: Secondary | ICD-10-CM | POA: Diagnosis not present

## 2022-01-10 DIAGNOSIS — Z01419 Encounter for gynecological examination (general) (routine) without abnormal findings: Secondary | ICD-10-CM | POA: Diagnosis not present

## 2022-01-10 MED ORDER — LEVONORGESTREL-ETHINYL ESTRAD 0.1-20 MG-MCG PO TABS
ORAL_TABLET | ORAL | 3 refills | Status: AC
  Filled 2022-01-10 – 2022-01-30 (×2): qty 112, 84d supply, fill #0

## 2022-01-30 ENCOUNTER — Other Ambulatory Visit (HOSPITAL_COMMUNITY): Payer: Self-pay

## 2022-03-27 ENCOUNTER — Other Ambulatory Visit (HOSPITAL_COMMUNITY): Payer: Self-pay

## 2022-03-27 ENCOUNTER — Telehealth: Payer: Self-pay | Admitting: Family Medicine

## 2022-03-27 ENCOUNTER — Other Ambulatory Visit: Payer: Self-pay

## 2022-03-27 MED ORDER — ONDANSETRON HCL 4 MG PO TABS: 4.0000 mg | ORAL_TABLET | Freq: Three times a day (TID) | ORAL | 1 refills | Status: AC | PRN

## 2022-03-27 MED ORDER — SUMATRIPTAN SUCCINATE 100 MG PO TABS
100.0000 mg | ORAL_TABLET | Freq: Every day | ORAL | 1 refills | Status: AC | PRN
Start: 2022-03-27 — End: ?

## 2022-03-27 MED ORDER — AMITRIPTYLINE HCL 10 MG PO TABS: 10.0000 mg | ORAL_TABLET | Freq: Every day | ORAL | 1 refills | Status: AC

## 2022-03-27 NOTE — Telephone Encounter (Signed)
Rxs sent and patient notified.

## 2022-03-27 NOTE — Telephone Encounter (Signed)
Patient called in and stated that [place] out [place] the [place]. She was wanting to know if her medications could be sent to [facility] [place] . Thank you!

## 2023-04-11 ENCOUNTER — Other Ambulatory Visit: Payer: Self-pay | Admitting: Family Medicine

## 2023-04-11 NOTE — Telephone Encounter (Signed)
See below- did anyone check with patient?

## 2023-04-11 NOTE — Telephone Encounter (Signed)
Please check with patient.  Did she move/establish at a new clinic?  Please let me know.  Thanks.

## 2023-04-11 NOTE — Telephone Encounter (Signed)
Last office visit: 10/13/2021 Next office visit: nothing scheduled Last refill:  SUMATRIPTAN SUCCINATE 100MG  TABS 03/27/22 24 tablets 1 refill

## 2023-04-11 NOTE — Telephone Encounter (Signed)
Last office visit: 10/13/21 Next office visit: nothing scheduled Last refill:   SUMATRIPTAN SUCCINATE 100MG  TABS 03/27/22 24 tablets 1 refill

## 2023-04-12 NOTE — Telephone Encounter (Signed)
Disregard request. Patient has moved out of state and the refill was sent to [place] in error.

## 2023-04-12 NOTE — Telephone Encounter (Signed)
Noted. Thanks.
